# Patient Record
Sex: Female | Born: 1950 | Race: White | Hispanic: No | Marital: Single | Smoking: Never smoker
Health system: Southern US, Community
[De-identification: ages and names within clinical notes are randomized; demographics above are authoritative.]

## PROBLEM LIST (undated history)

## (undated) DIAGNOSIS — F32A Depression, unspecified: Secondary | ICD-10-CM

## (undated) DIAGNOSIS — H269 Unspecified cataract: Secondary | ICD-10-CM

## (undated) DIAGNOSIS — J309 Allergic rhinitis, unspecified: Secondary | ICD-10-CM

## (undated) DIAGNOSIS — G8929 Other chronic pain: Secondary | ICD-10-CM

## (undated) DIAGNOSIS — J45909 Unspecified asthma, uncomplicated: Secondary | ICD-10-CM

## (undated) DIAGNOSIS — R233 Spontaneous ecchymoses: Secondary | ICD-10-CM

## (undated) DIAGNOSIS — H409 Unspecified glaucoma: Secondary | ICD-10-CM

## (undated) DIAGNOSIS — T8859XA Other complications of anesthesia, initial encounter: Secondary | ICD-10-CM

## (undated) DIAGNOSIS — R238 Other skin changes: Secondary | ICD-10-CM

## (undated) DIAGNOSIS — M810 Age-related osteoporosis without current pathological fracture: Secondary | ICD-10-CM

## (undated) DIAGNOSIS — J189 Pneumonia, unspecified organism: Secondary | ICD-10-CM

## (undated) DIAGNOSIS — D649 Anemia, unspecified: Secondary | ICD-10-CM

## (undated) DIAGNOSIS — M2550 Pain in unspecified joint: Secondary | ICD-10-CM

## (undated) DIAGNOSIS — E119 Type 2 diabetes mellitus without complications: Secondary | ICD-10-CM

## (undated) DIAGNOSIS — M1990 Unspecified osteoarthritis, unspecified site: Secondary | ICD-10-CM

## (undated) DIAGNOSIS — F429 Obsessive-compulsive disorder, unspecified: Secondary | ICD-10-CM

## (undated) DIAGNOSIS — K219 Gastro-esophageal reflux disease without esophagitis: Secondary | ICD-10-CM

## (undated) DIAGNOSIS — M549 Dorsalgia, unspecified: Secondary | ICD-10-CM

## (undated) DIAGNOSIS — F329 Major depressive disorder, single episode, unspecified: Secondary | ICD-10-CM

## (undated) DIAGNOSIS — E785 Hyperlipidemia, unspecified: Secondary | ICD-10-CM

## (undated) DIAGNOSIS — I10 Essential (primary) hypertension: Secondary | ICD-10-CM

## (undated) DIAGNOSIS — E559 Vitamin D deficiency, unspecified: Secondary | ICD-10-CM

## (undated) DIAGNOSIS — M62838 Other muscle spasm: Secondary | ICD-10-CM

## (undated) DIAGNOSIS — R7302 Impaired glucose tolerance (oral): Secondary | ICD-10-CM

## (undated) DIAGNOSIS — Z8709 Personal history of other diseases of the respiratory system: Secondary | ICD-10-CM

## (undated) HISTORY — PX: TONGUE BIOPSY: SHX1075

## (undated) HISTORY — DX: Vitamin D deficiency, unspecified: E55.9

## (undated) HISTORY — DX: Allergic rhinitis, unspecified: J30.9

## (undated) HISTORY — DX: Essential (primary) hypertension: I10

## (undated) HISTORY — PX: TONSILLECTOMY AND ADENOIDECTOMY: SUR1326

## (undated) HISTORY — DX: Anemia, unspecified: D64.9

## (undated) HISTORY — DX: Unspecified osteoarthritis, unspecified site: M19.90

## (undated) HISTORY — PX: EYE SURGERY: SHX253

## (undated) HISTORY — PX: ESOPHAGOGASTRODUODENOSCOPY: SHX1529

## (undated) HISTORY — DX: Age-related osteoporosis without current pathological fracture: M81.0

## (undated) HISTORY — PX: WISDOM TOOTH EXTRACTION: SHX21

## (undated) HISTORY — DX: Unspecified asthma, uncomplicated: J45.909

## (undated) HISTORY — DX: Unspecified glaucoma: H40.9

## (undated) HISTORY — DX: Type 2 diabetes mellitus without complications: E11.9

## (undated) HISTORY — DX: Hyperlipidemia, unspecified: E78.5

## (undated) HISTORY — DX: Impaired glucose tolerance (oral): R73.02

## (undated) HISTORY — PX: ELBOW SURGERY: SHX618

---

## 2000-05-26 ENCOUNTER — Emergency Department (HOSPITAL_COMMUNITY): Admission: EM | Admit: 2000-05-26 | Discharge: 2000-05-26 | Payer: Self-pay | Admitting: Emergency Medicine

## 2000-10-06 ENCOUNTER — Other Ambulatory Visit: Admission: RE | Admit: 2000-10-06 | Discharge: 2000-10-06 | Payer: Self-pay | Admitting: Internal Medicine

## 2003-11-12 ENCOUNTER — Emergency Department (HOSPITAL_COMMUNITY): Admission: EM | Admit: 2003-11-12 | Discharge: 2003-11-13 | Payer: Self-pay | Admitting: Emergency Medicine

## 2003-11-18 ENCOUNTER — Ambulatory Visit (HOSPITAL_BASED_OUTPATIENT_CLINIC_OR_DEPARTMENT_OTHER): Admission: RE | Admit: 2003-11-18 | Discharge: 2003-11-18 | Payer: Self-pay | Admitting: Orthopedic Surgery

## 2004-07-05 ENCOUNTER — Ambulatory Visit: Payer: Self-pay | Admitting: Internal Medicine

## 2009-02-11 ENCOUNTER — Other Ambulatory Visit: Admission: RE | Admit: 2009-02-11 | Discharge: 2009-02-11 | Payer: Self-pay | Admitting: Internal Medicine

## 2009-06-08 ENCOUNTER — Encounter: Admission: RE | Admit: 2009-06-08 | Discharge: 2009-06-08 | Payer: Self-pay | Admitting: Internal Medicine

## 2009-06-29 ENCOUNTER — Encounter: Admission: RE | Admit: 2009-06-29 | Discharge: 2009-06-29 | Payer: Self-pay | Admitting: Internal Medicine

## 2009-12-18 ENCOUNTER — Encounter: Admission: RE | Admit: 2009-12-18 | Discharge: 2009-12-18 | Payer: Self-pay | Admitting: Internal Medicine

## 2010-07-30 NOTE — Op Note (Signed)
NAME:  Amanda Odonnell, Amanda Odonnell                        ACCOUNT NO.:  [id number]   MEDICAL RECORD NO.:  [id number]                   PATIENT TYPE:  AMB   LOCATION:  DSC                                  FACILITY:  [facility]   PHYSICIAN:  Katy Fitch. Naaman Plummer., M.D.          DATE OF BIRTH:  1950-11-17   DATE OF PROCEDURE:  11/18/2003  DATE OF DISCHARGE:                                 OPERATIVE REPORT   PREOPERATIVE DIAGNOSIS:  Comminuted Monteggia fracture dislocation of right  elbow with three part displaced radial head fracture and posterior  dislocation of radius with disruption of proximal radial ulnar joint.   POSTOPERATIVE DIAGNOSIS:  Comminuted Monteggia fracture dislocation of right  elbow with three part displaced radial head fracture and posterior  dislocation of radius with disruption of proximal radial ulnar joint.   OPERATION:  1.  Open reduction and internal fixation of right ulna with 90/90 congruent      titanium plates.  2.  Open reduction and internal fixation of right radial head utilizing 5      Leibinger lag screws.  3.  Reconstruction of capsule of radiocapitellar and proximal radial ulnar      joint.   CO-SURGEON:  Katy Fitch Sypher, M.D.  Cindee Salt, M.D.   ANESTHESIA:  General by LMA.   ANESTHESIOLOGIST:  Sheldon Silvan, M.D.  Janetta Hora. Frederick, M.D.   TOURNIQUET TIME:  2 hours at 240 mmHg.   ESTIMATED BLOOD LOSS:  Minimal.   COMPLICATIONS:  None apparent.   INDICATIONS FOR PROCEDURE:  Amanda Odonnell is a 60 year old right hand  dominant employee of [employer].  On November 13, 2003, she fell onto  her outstretched right arm sustaining an extremely complex fracture  dislocation of her right elbow while descending steps at [employer].  She was transported to the [facility] emergency room  where she was evaluated by Dr. Gray Bernhardt.  Dr. Effie Shy noted an intact  neurovascular status and obvious deformity of the elbow region.  Plane  x-  rays demonstrated a severely comminuted fracture dislocation of the elbow.  Dr. Jearld Adjutant was on call for orthopedics at [facility] and saw Ms.  Nee for consultation.  He noted a posterior dislocation of the proximal  radius and after informed consent, performed a closed reduction and  placement in a posterior elbow splint.  He sought an upper extremity trauma  consult.  Ms. Lazaro was stabilized and discharged home with prescriptions  for pain medications.  She was seen on November 14, 2003, [facility] [place] [facility] for evaluation and preoperative counseling.  We reviewed her  x-rays and clinical examination.  She was noted to have intact function of  her median, ulnar, and radial nerves and only 2+ swelling of her fingers.  She did not appear to have any shoulder injuries.  Her past medical history  is reviewed in detail.  We advised  her to proceed with elective open  reduction and internal fixation initially scheduled for November 17, 2003,  at [facility], however, due to a change in her orthopedic trauma  protocol, she was resched[facility] at [facility] time.  Preoperatively, we reviewed the potential benefits of surgery including  anatomic reduction of her radius and ulna as well as reconstruction of her  proximal radial ulnar joint allowing maximum recovery of pronation and  supination with elbow flexion extension as well as stability.  The potential  risks given the nature of this fracture include a neurovascular injury,  infection, nonunion, failure of fixation, and a significant risk of  development of a proximal radial ulnar synostosis due to the severe  comminution of the fractured and marked soft tissue disruption of the  proximal radial ulnar joint.  After a period of invited questions and  answers, she elected to proceed with surgery at this time.   PROCEDURE:  Shalice Woodring is brought to the operating room and placed on  supine  position on the operating table.  Following anesthesia consultation  by Dr. Ivin Booty, general anesthesia by LMA technique was induced.  The right  arm was supported on a hand table and prepped with Betadine solution to the  axilla.  A sterile stockinette and a sterile pneumatic tourniquet was  applied to the proximal brachium followed by impervious arthroscopy drapes.  1 gram Ancef was administered as an IV prophylactic antibiotic.  The right  arm was exsanguinated with an Esmarch bandage and the arterial tourniquet  inflated to 240 mmHg.  The procedure commenced with a 20+ cm longitudinal  incision extending from the triceps insertion on the olecranon along the  posterior subcutaneous border of the ulna over a distance of approximately  15 cm into the forearm.  The subcutaneous tissues were carefully divided  taking care to spread the longitudinal veins and sensory nerves.  The  subcutaneous border of the ulna was located distally and the distal fragment  of the ulna gently exposed.  The proximal fragment of the ulna was,  likewise, exposed with subperiosteal dissection along its ulnar border and a  large triangular free fragment of the ulna as well as several other small  butterfly fragments were identified.  Small free fragments devoid of blood  supply were discarded.  Organized hematoma was removed from the fracture  site followed by clearance of all periosteum from the fracture lines.  Given  the complex radial head injury, we elected to reconstruct the radial head  primarily.  The radial head was easily visualized through the posterior  wound.  With maneuvers of pronation and supination, the radial head  fragments were recovered followed by assembling and holding with hemostats  and clamps.  A total of 5 titanium lag screws were placed using C-arm  fluoroscopic control to monitor screw length.  The radius was reduced and full pronation supination was recovered with apparent stability of  the  proximal radial ulnar joint with the ulna held in near anatomic reduction.  We then applied a congruent posterior olecranon plate spanning the fracture  with at least eight cortices on the proximal and distal sides of the  fracture.  A very challenging butterfly fragment was ultimately controlled  and replaced in anatomic position.  Given the complexity of the butterfly, a  90/90 plate was applied with an additional four hole plate trapping the  butterfly along the ulnar aspect of the proximal ulna.  AP and lateral  C-arm  images confirmed anatomic reconstruction.  The wound was then thoroughly  irrigated with sterile saline followed by repair of the flexor carpi ulnaris  and extensor carpi ulnaris muscles with a running suture of 0 Vicryl  followed by repair of the skin with interrupted suture of 3-0 Vicryl and  intradermal 3-0 Prolene with Steri-Strips.  A voluminous gauze and sterile  Webril dressing was applied followed by application of a radial ulnar elbow  extension limitation splint with Ace wrap.  There were no apparent  complications.  Ms. Ballinger tolerated the surgery and anesthsia well.  She  was awakened from general anesthesia and transferred to the recovery room  with stable vital signs.  She  will be [facility] for observation of her vital  signs with Ancef 1 gram IV q.8h. as prophylactic antibiotic x 3 doses and  analgesics in the form of IV PCA morphine, IV and p.o. Dilaudid and her  routine medications.                                               Katy Fitch Naaman Plummer., M.D.    RVS/MEDQ  D:  11/18/2003  T:  11/18/2003  Job:  [id number]   cc:   Cindee Salt, M.D.  [address]  [place]  [place] [address]  Fax: [phone]   V. Charlesetta Shanks, M.D.  [address]  [place], [place] [address]  Fax: [phone]

## 2011-01-26 ENCOUNTER — Other Ambulatory Visit: Payer: Self-pay | Admitting: Internal Medicine

## 2011-01-26 DIAGNOSIS — N63 Unspecified lump in unspecified breast: Secondary | ICD-10-CM

## 2011-02-09 ENCOUNTER — Ambulatory Visit
Admission: RE | Admit: 2011-02-09 | Discharge: 2011-02-09 | Disposition: A | Discharge status: Home / Self Care | Payer: 59 | Source: Ambulatory Visit | Attending: Internal Medicine | Admitting: Internal Medicine

## 2011-02-09 DIAGNOSIS — N63 Unspecified lump in unspecified breast: Secondary | ICD-10-CM

## 2012-07-03 ENCOUNTER — Other Ambulatory Visit: Payer: Self-pay

## 2012-07-03 DIAGNOSIS — Z1231 Encounter for screening mammogram for malignant neoplasm of breast: Secondary | ICD-10-CM

## 2012-07-10 ENCOUNTER — Encounter: Payer: Self-pay | Admitting: Gastroenterology

## 2012-07-12 ENCOUNTER — Other Ambulatory Visit: Payer: Self-pay | Admitting: Internal Medicine

## 2012-07-12 DIAGNOSIS — R634 Abnormal weight loss: Secondary | ICD-10-CM

## 2012-07-18 ENCOUNTER — Ambulatory Visit
Admission: RE | Admit: 2012-07-18 | Discharge: 2012-07-18 | Disposition: A | Discharge status: Home / Self Care | Payer: 59 | Source: Ambulatory Visit | Attending: Internal Medicine | Admitting: Internal Medicine

## 2012-07-18 DIAGNOSIS — R634 Abnormal weight loss: Secondary | ICD-10-CM

## 2012-07-25 ENCOUNTER — Ambulatory Visit
Admission: RE | Admit: 2012-07-25 | Discharge: 2012-07-25 | Disposition: A | Discharge status: Home / Self Care | Payer: 59 | Source: Ambulatory Visit

## 2012-07-25 DIAGNOSIS — Z1231 Encounter for screening mammogram for malignant neoplasm of breast: Secondary | ICD-10-CM

## 2012-08-01 ENCOUNTER — Encounter: Payer: Self-pay | Admitting: Gastroenterology

## 2012-08-01 ENCOUNTER — Ambulatory Visit (INDEPENDENT_AMBULATORY_CARE_PROVIDER_SITE_OTHER): Payer: 59 | Admitting: Gastroenterology

## 2012-08-01 VITALS — BP 124/70 | HR 96 | Ht 60.0 in | Wt 154.4 lb

## 2012-08-01 DIAGNOSIS — D509 Iron deficiency anemia, unspecified: Secondary | ICD-10-CM

## 2012-08-01 MED ORDER — PEG-KCL-NACL-NASULF-NA ASC-C 100 G PO SOLR: 1.0000 | Freq: Once | ORAL | Status: DC

## 2012-08-01 NOTE — Patient Instructions (Addendum)
You have been scheduled for an endoscopy and colonoscopy with propofol. Please follow the written instructions given to you at your visit today. Please pick up your prep at the pharmacy within the next 1-3 days. If you use inhalers (even only as needed), please bring them with you on the day of your procedure. Your physician has requested that you go to www.startemmi.com and enter the access code given to you at your visit today. This web site gives a general overview about your procedure. However, you should still follow specific instructions given to you by our office regarding your preparation for the procedure.  Thank you for choosing me and [facility].  Venita Lick. Pleas Koch., MD., Clementeen Graham  cc: Pearson Grippe, MD

## 2012-08-01 NOTE — Progress Notes (Signed)
History of Present Illness: This is a 62 year old female was recently found to have an iron deficiency anemia, hemoglobin = 10.6,  ferritin = 10. She has no gastrointestinal complaints. She is not previously at colonoscopy or endoscopy. Denies weight loss, abdominal pain, constipation, diarrhea, change in stool caliber, melena, hematochezia, nausea, vomiting, dysphagia, reflux symptoms, chest pain.  Review of Systems: Pertinent positive and negative review of systems were noted in the above HPI section. All other review of systems were otherwise negative.  Current Medications, Allergies, Past Medical History, Past Surgical History, Family History and Social History were reviewed in [employer] record.  Physical Exam: General: Well developed , well nourished, no acute distress Head: Normocephalic and atraumatic Eyes:  sclerae anicteric, EOMI Ears: Normal auditory acuity Mouth: No deformity or lesions Neck: Supple, no masses or thyromegaly Lungs: Clear throughout to auscultation Heart: Regular rate and rhythm; no murmurs, rubs or bruits Abdomen: Soft, non tender and non distended. No masses, hepatosplenomegaly or hernias noted. Normal Bowel sounds Rectal: Deferred to colonoscopy Musculoskeletal: Symmetrical with no gross deformities  Skin: No lesions on visible extremities Pulses:  Normal pulses noted Extremities: No clubbing, cyanosis, edema or deformities noted Neurological: Alert oriented x 4, grossly nonfocal Cervical Nodes:  No significant cervical adenopathy Inguinal Nodes: No significant inguinal adenopathy Psychological:  Alert and cooperative. Normal mood and affect  Assessment and Recommendations:  1. Unexplained iron deficiency anemia. Rule out occult gastrointestinal losses or poor iron absorption. She was somewhat reluctant to schedule colonoscopy and upper endoscopy but ultimately agreed to do so. The risks, benefits, and alternatives to colonoscopy with  possible biopsy and possible polypectomy were discussed with the patient and they consent to proceed. The risks, benefits, and alternatives to endoscopy with possible biopsy and possible dilation were discussed with the patient and they consent to proceed.

## 2012-08-08 ENCOUNTER — Observation Stay (HOSPITAL_COMMUNITY): Payer: 59

## 2012-08-08 ENCOUNTER — Observation Stay (HOSPITAL_COMMUNITY)
Admission: AD | Admit: 2012-08-08 | Discharge: 2012-08-09 | Disposition: A | Discharge status: Home / Self Care | Payer: 59 | Source: Ambulatory Visit | Attending: Gastroenterology | Admitting: Gastroenterology

## 2012-08-08 ENCOUNTER — Ambulatory Visit (AMBULATORY_SURGERY_CENTER): Payer: 59 | Admitting: Gastroenterology

## 2012-08-08 ENCOUNTER — Encounter: Payer: Self-pay | Admitting: Gastroenterology

## 2012-08-08 ENCOUNTER — Encounter (HOSPITAL_COMMUNITY): Payer: Self-pay | Admitting: *Deleted

## 2012-08-08 VITALS — BP 136/64 | HR 80 | Temp 98.2°F | Resp 23 | Ht 60.0 in | Wt 154.0 lb

## 2012-08-08 DIAGNOSIS — J449 Chronic obstructive pulmonary disease, unspecified: Secondary | ICD-10-CM | POA: Insufficient documentation

## 2012-08-08 DIAGNOSIS — R0902 Hypoxemia: Secondary | ICD-10-CM

## 2012-08-08 DIAGNOSIS — I10 Essential (primary) hypertension: Secondary | ICD-10-CM | POA: Insufficient documentation

## 2012-08-08 DIAGNOSIS — J954 Chemical pneumonitis due to anesthesia: Principal | ICD-10-CM

## 2012-08-08 DIAGNOSIS — E119 Type 2 diabetes mellitus without complications: Secondary | ICD-10-CM | POA: Insufficient documentation

## 2012-08-08 DIAGNOSIS — D509 Iron deficiency anemia, unspecified: Secondary | ICD-10-CM

## 2012-08-08 DIAGNOSIS — J690 Pneumonitis due to inhalation of food and vomit: Secondary | ICD-10-CM

## 2012-08-08 DIAGNOSIS — Y849 Medical procedure, unspecified as the cause of abnormal reaction of the patient, or of later complication, without mention of misadventure at the time of the procedure: Secondary | ICD-10-CM | POA: Insufficient documentation

## 2012-08-08 DIAGNOSIS — E7800 Pure hypercholesterolemia, unspecified: Secondary | ICD-10-CM | POA: Insufficient documentation

## 2012-08-08 DIAGNOSIS — J4489 Other specified chronic obstructive pulmonary disease: Secondary | ICD-10-CM | POA: Insufficient documentation

## 2012-08-08 HISTORY — PX: COLONOSCOPY: SHX174

## 2012-08-08 LAB — CREATININE, SERUM
Creatinine, Ser: 0.69 mg/dL (ref 0.50–1.10)
GFR calc non Af Amer: >90 mL/min (ref >90)

## 2012-08-08 LAB — GLUCOSE, CAPILLARY: Glucose-Capillary: 119 mg/dL — ABNORMAL HIGH (ref 70–99)

## 2012-08-08 LAB — CBC
Hemoglobin: 10.3 g/dL — ABNORMAL LOW (ref 12.0–15.0)
MCH: 23.6 pg — ABNORMAL LOW (ref 26.0–34.0)
Platelets: 334 10*3/uL (ref 150–400)
RBC: 4.37 MIL/uL (ref 3.87–5.11)
WBC: 13.2 10*3/uL — ABNORMAL HIGH (ref 4.0–10.5)

## 2012-08-08 MED ORDER — ONDANSETRON HCL 4 MG/2ML IJ SOLN: 4.0000 mg | Freq: Four times a day (QID) | INTRAMUSCULAR | Status: DC | PRN

## 2012-08-08 MED ORDER — MOMETASONE FURO-FORMOTEROL FUM 100-5 MCG/ACT IN AERO: 2.0000 | INHALATION_SPRAY | Freq: Two times a day (BID) | RESPIRATORY_TRACT | Status: DC

## 2012-08-08 MED ORDER — INDAPAMIDE 2.5 MG PO TABS
2.5000 mg | ORAL_TABLET | Freq: Every day | ORAL | Status: DC
  Administered 2012-08-09: 2.5 mg via ORAL
  Filled 2012-08-08: qty 1

## 2012-08-08 MED ORDER — ONDANSETRON HCL 4 MG PO TABS: 4.0000 mg | ORAL_TABLET | Freq: Four times a day (QID) | ORAL | Status: DC | PRN

## 2012-08-08 MED ORDER — ENOXAPARIN SODIUM 40 MG/0.4ML ~~LOC~~ SOLN
40.0000 mg | SUBCUTANEOUS | Status: DC
  Administered 2012-08-08: 40 mg via SUBCUTANEOUS
  Filled 2012-08-08 (×2): qty 0.4

## 2012-08-08 MED ORDER — TRAVOPROST (BAK FREE) 0.004 % OP SOLN: 1.0000 [drp] | Freq: Every day | OPHTHALMIC | Status: DC

## 2012-08-08 MED ORDER — ASPIRIN 81 MG PO CHEW
81.0000 mg | CHEWABLE_TABLET | Freq: Every day | ORAL | Status: DC
  Administered 2012-08-09: 81 mg via ORAL
  Filled 2012-08-08 (×2): qty 1

## 2012-08-08 MED ORDER — ACETAMINOPHEN 650 MG RE SUPP: 650.0000 mg | Freq: Four times a day (QID) | RECTAL | Status: DC | PRN

## 2012-08-08 MED ORDER — VERAPAMIL HCL ER 360 MG PO CP24: 360.0000 mg | ORAL_CAPSULE | Freq: Every day | ORAL | Status: DC

## 2012-08-08 MED ORDER — BRINZOLAMIDE-BRIMONIDINE 1-0.2 % OP SUSP: 1.0000 [drp] | Freq: Two times a day (BID) | OPHTHALMIC | Status: DC

## 2012-08-08 MED ORDER — SODIUM CHLORIDE 0.9 % IV SOLN: 500.0000 mL | INTRAVENOUS | Status: DC

## 2012-08-08 MED ORDER — SERTRALINE HCL 100 MG PO TABS
100.0000 mg | ORAL_TABLET | Freq: Every day | ORAL | Status: DC
  Administered 2012-08-08: 100 mg via ORAL
  Filled 2012-08-08 (×2): qty 1

## 2012-08-08 MED ORDER — ASPIRIN 81 MG PO TABS: 81.0000 mg | ORAL_TABLET | Freq: Every day | ORAL | Status: DC

## 2012-08-08 MED ORDER — MAGNESIUM 300 MG PO CAPS: 1.0000 | ORAL_CAPSULE | Freq: Every day | ORAL | Status: DC

## 2012-08-08 MED ORDER — SODIUM CHLORIDE 0.45 % IV SOLN: INTRAVENOUS | Status: DC

## 2012-08-08 MED ORDER — SODIUM CHLORIDE 0.9 % IV SOLN: 4.0000 mg | Freq: Four times a day (QID) | INTRAVENOUS | Status: DC | PRN

## 2012-08-08 MED ORDER — VERAPAMIL HCL ER 120 MG PO TBCR: 360.0000 mg | EXTENDED_RELEASE_TABLET | Freq: Every day | ORAL | Status: DC

## 2012-08-08 MED ORDER — PIPERACILLIN SOD-TAZOBACTAM SO 2.25 (2-0.25) G IV SOLR: 3.3750 g | Freq: Four times a day (QID) | INTRAVENOUS | Status: DC

## 2012-08-08 MED ORDER — DIPHENHYDRAMINE HCL 25 MG PO CAPS: 25.0000 mg | ORAL_CAPSULE | Freq: Four times a day (QID) | ORAL | Status: DC | PRN

## 2012-08-08 MED ORDER — MONTELUKAST SODIUM 10 MG PO TABS: 10.0000 mg | ORAL_TABLET | Freq: Every day | ORAL | Status: DC

## 2012-08-08 MED ORDER — VERAPAMIL HCL ER 180 MG PO TBCR
360.0000 mg | EXTENDED_RELEASE_TABLET | Freq: Every day | ORAL | Status: DC
  Administered 2012-08-08: 360 mg via ORAL
  Filled 2012-08-08 (×2): qty 2

## 2012-08-08 MED ORDER — MOMETASONE FURO-FORMOTEROL FUM 100-5 MCG/ACT IN AERO
2.0000 | INHALATION_SPRAY | Freq: Two times a day (BID) | RESPIRATORY_TRACT | Status: DC
  Administered 2012-08-08 – 2012-08-09 (×2): 2 via RESPIRATORY_TRACT
  Filled 2012-08-08: qty 8.8

## 2012-08-08 MED ORDER — BRINZOLAMIDE 1 % OP SUSP
1.0000 [drp] | Freq: Two times a day (BID) | OPHTHALMIC | Status: DC
  Administered 2012-08-08 – 2012-08-09 (×2): 1 [drp] via OPHTHALMIC
  Filled 2012-08-08: qty 10

## 2012-08-08 MED ORDER — ACETAMINOPHEN 325 MG PO TABS
650.0000 mg | ORAL_TABLET | Freq: Four times a day (QID) | ORAL | Status: DC | PRN
  Administered 2012-08-09: 650 mg via ORAL
  Filled 2012-08-08: qty 2

## 2012-08-08 MED ORDER — FAMOTIDINE 20 MG PO TABS
20.0000 mg | ORAL_TABLET | Freq: Two times a day (BID) | ORAL | Status: DC
  Administered 2012-08-08 – 2012-08-09 (×2): 20 mg via ORAL
  Filled 2012-08-08 (×3): qty 1

## 2012-08-08 MED ORDER — SERTRALINE HCL 25 MG PO TABS: 100.0000 mg | ORAL_TABLET | Freq: Every day | ORAL | Status: DC

## 2012-08-08 MED ORDER — ALBUTEROL SULFATE HFA 108 (90 BASE) MCG/ACT IN AERS
2.0000 | INHALATION_SPRAY | Freq: Every day | RESPIRATORY_TRACT | Status: DC
  Administered 2012-08-09: 2 via RESPIRATORY_TRACT
  Filled 2012-08-08: qty 6.7

## 2012-08-08 MED ORDER — MONTELUKAST SODIUM 10 MG PO TABS
10.0000 mg | ORAL_TABLET | Freq: Every day | ORAL | Status: DC
  Administered 2012-08-08: 10 mg via ORAL
  Filled 2012-08-08 (×2): qty 1

## 2012-08-08 MED ORDER — ALBUTEROL SULFATE (5 MG/ML) 0.5% IN NEBU: 2.5000 mg | INHALATION_SOLUTION | Freq: Four times a day (QID) | RESPIRATORY_TRACT | Status: DC

## 2012-08-08 MED ORDER — METHOCARBAMOL 500 MG PO TABS
500.0000 mg | ORAL_TABLET | Freq: Four times a day (QID) | ORAL | Status: DC
  Administered 2012-08-08 – 2012-08-09 (×3): 500 mg via ORAL
  Filled 2012-08-08 (×5): qty 1

## 2012-08-08 MED ORDER — ACETAMINOPHEN 325 MG PO TABS: 650.0000 mg | ORAL_TABLET | Freq: Four times a day (QID) | ORAL | Status: DC | PRN

## 2012-08-08 MED ORDER — BRIMONIDINE TARTRATE 0.2 % OP SOLN
1.0000 [drp] | Freq: Two times a day (BID) | OPHTHALMIC | Status: DC
  Administered 2012-08-08 – 2012-08-09 (×2): 1 [drp] via OPHTHALMIC
  Filled 2012-08-08: qty 5

## 2012-08-08 MED ORDER — TRAVOPROST (BAK FREE) 0.004 % OP SOLN
1.0000 [drp] | Freq: Every day | OPHTHALMIC | Status: DC
  Administered 2012-08-08: 1 [drp] via OPHTHALMIC
  Filled 2012-08-08: qty 2.5

## 2012-08-08 MED ORDER — PANTOPRAZOLE SODIUM 20 MG PO TBEC: 40.0000 mg | DELAYED_RELEASE_TABLET | Freq: Two times a day (BID) | ORAL | Status: DC

## 2012-08-08 MED ORDER — TRAVOPROST 0.004 % OP SOLN: 1.0000 [drp] | Freq: Every day | OPHTHALMIC | Status: DC

## 2012-08-08 NOTE — Progress Notes (Signed)
DECISION TO TRANSFER TO [facility] FOR ADMISSION. DR Russella Dar CONTACTING PA , [name] WHO CONTACTED BED CONTROL. ROOM ASSIGNMENT OBTAINED AT 1650. DECISION  FOR AMBULANCE TRANSPORT WITH 02 AND RUNNING IV NS. 911 CALLED FOR TRANSPORT. PATIENT VOIDED 120 ML YELLOW URINE AT [address]. PATIENT TRANSFERRED VIA [facility] AT 1512. 02 AND RUNNING NS. PATIENT STATING SHE IS NOT FEELING WELL, COLOR PALE, NO DYSPNEA NOTED. NO NAUSEA OR VOMITING. VITAL SIGNS STABLE, TEMP 98.2

## 2012-08-08 NOTE — Progress Notes (Signed)
Patient did not have preoperative order for IV antibiotic SSI prophylaxis. (G8918)Patient experienced a hospital transfer or hospital admission upon discharge from [facility]. [phone])

## 2012-08-08 NOTE — Progress Notes (Addendum)
Lidocaine-40mg  IV prior to Propofol InductionPropofol given over incremental dosages. Only colonoscopy done due to vomiting episode. Sat dropped to 80-90. Dr Russella Dar aware and possible post op CXR will be ordered.CAB-BBS prior to PACU admit. Awake and able to cough. Chest clear.

## 2012-08-08 NOTE — H&P (Signed)
Primary Care Physician:  Pearson Grippe, MD Primary Gastroenterologist:   Claudette Head, MD  CHIEF COMPLAINT:  Hypoxemia, suspected aspiration  HPI: Amanda Odonnell is a 62 y.o. female who underwent colonoscopy for evaluation of fe deficiency anemia. The colonoscopy was normal and the EGD was cancelled due to agitation, pt movement, nausea, vomiting and then hypoxemia with suspected aspiration event. Her O2 sat preprocedure was 98% on RA and afterward was 88% on RA. Increased to 91-93% on 2 liters O2 by . She has underlying COPD and asthma. She took her inhaler earlier today and post procedure. She notes a sore throat and mid cough. No other complaints. She had a root canal yesterday but has not started Amoxicillin as prescribed.   Past Medical History  Diagnosis Date  . Hypertension   . Hyperlipidemia   . Glucose intolerance (impaired glucose tolerance)   . Asthma   . Allergic rhinitis   . Vitamin D deficiency   . Osteoporosis   . Osteoarthritis   . Stress fracture of hip     femeur of right hip  . Anemia   . COPD (chronic obstructive pulmonary disease)   . DM (diabetes mellitus)   . Anxiety   . Glaucoma     Past Surgical History  Procedure Laterality Date  . Tonsillectomy and adenoidectomy    . Tongue biopsy    . Wisdom tooth extraction    . Elbow surgery Right     plates and pins    Prior to Admission medications   Medication Sig Start Date End Date Taking? Authorizing Provider  aspirin 81 MG tablet Take 81 mg by mouth daily.   Yes Historical Provider, MD  beta carotene 57846 UNIT capsule Take 25,000 Units by mouth daily.   Yes Historical Provider, MD  Brinzolamide-Brimonidine [facility]) 1-0.2 % SUSP Place 1 drop into both eyes 2 (two) times daily.   Yes Historical Provider, MD  Calcium-Magnesium-Vitamin D (CALCIUM 500 PO) Take 1 tablet by mouth 2 (two) times daily.   Yes Historical Provider, MD  cholecalciferol (VITAMIN D) 1000 UNITS tablet Take 1,000 Units by mouth  daily.   Yes Historical Provider, MD  Cinnamon 500 MG capsule Take 500 mg by mouth daily.   Yes Historical Provider, MD  Coenzyme Q10 (CO Q 10) 100 MG CAPS Take 1 capsule by mouth daily.   Yes Historical Provider, MD  diphenhydrAMINE (BENADRYL ALLERGY) 25 mg capsule Take 25 mg by mouth every 6 (six) hours as needed for itching.   Yes Historical Provider, MD  Fluticasone-Salmeterol (ADVAIR) 100-50 MCG/DOSE AEPB Inhale 1 puff into the lungs 2 (two) times daily.   Yes Historical Provider, MD  ibuprofen (ADVIL,MOTRIN) 200 MG tablet Take 200 mg by mouth every 6 (six) hours as needed for pain.   Yes Historical Provider, MD  indapamide (LOZOL) 2.5 MG tablet Take 2.5 mg by mouth daily.   Yes Historical Provider, MD  Magnesium 300 MG CAPS Take 1 capsule by mouth daily.   Yes Historical Provider, MD  methocarbamol (ROBAXIN) 500 MG tablet Take 500 mg by mouth 4 (four) times daily.   Yes Historical Provider, MD  Misc Natural Products (TART CHERRY ADVANCED PO) Take by mouth as directed.   Yes Historical Provider, MD  montelukast (SINGULAIR) 10 MG tablet Take 10 mg by mouth at bedtime.   Yes Historical Provider, MD  naproxen sodium (ANAPROX) 220 MG tablet Take 220 mg by mouth as needed.   Yes Historical Provider, MD  ranitidine (ZANTAC) 150 MG  capsule Take 150 mg by mouth 2 (two) times daily.   Yes Historical Provider, MD  rosuvastatin (CRESTOR) 10 MG tablet Take 10 mg by mouth daily.   Yes Historical Provider, MD  sertraline (ZOLOFT) 100 MG tablet Take 100 mg by mouth daily.   Yes Historical Provider, MD  travoprost, benzalkonium, (TRAVATAN) 0.004 % ophthalmic solution 1 drop at bedtime.   Yes Historical Provider, MD  verapamil (VERELAN PM) 360 MG 24 hr capsule Take 360 mg by mouth at bedtime.   Yes Historical Provider, MD  albuterol (PROVENTIL HFA;VENTOLIN HFA) 108 (90 BASE) MCG/ACT inhaler Inhale 2 puffs into the lungs daily.    Historical Provider, MD  Omega-3 Fatty Acids (FISH OIL) 500 MG CAPS Take 1  capsule by mouth 2 (two) times daily.    Historical Provider, MD    Current Outpatient Prescriptions  Medication Sig Dispense Refill  . aspirin 81 MG tablet Take 81 mg by mouth daily.      . beta carotene 16109 UNIT capsule Take 25,000 Units by mouth daily.      . Brinzolamide-Brimonidine (SIMBRINZA) 1-0.2 % SUSP Place 1 drop into both eyes 2 (two) times daily.      . Calcium-Magnesium-Vitamin D (CALCIUM 500 PO) Take 1 tablet by mouth 2 (two) times daily.      . cholecalciferol (VITAMIN D) 1000 UNITS tablet Take 1,000 Units by mouth daily.      . Cinnamon 500 MG capsule Take 500 mg by mouth daily.      . Coenzyme Q10 (CO Q 10) 100 MG CAPS Take 1 capsule by mouth daily.      . diphenhydrAMINE (BENADRYL ALLERGY) 25 mg capsule Take 25 mg by mouth every 6 (six) hours as needed for itching.      . Fluticasone-Salmeterol (ADVAIR) 100-50 MCG/DOSE AEPB Inhale 1 puff into the lungs 2 (two) times daily.      Marland Kitchen ibuprofen (ADVIL,MOTRIN) 200 MG tablet Take 200 mg by mouth every 6 (six) hours as needed for pain.      . indapamide (LOZOL) 2.5 MG tablet Take 2.5 mg by mouth daily.      . Magnesium 300 MG CAPS Take 1 capsule by mouth daily.      . methocarbamol (ROBAXIN) 500 MG tablet Take 500 mg by mouth 4 (four) times daily.      . Misc Natural Products (TART CHERRY ADVANCED PO) Take by mouth as directed.      . montelukast (SINGULAIR) 10 MG tablet Take 10 mg by mouth at bedtime.      . naproxen sodium (ANAPROX) 220 MG tablet Take 220 mg by mouth as needed.      . ranitidine (ZANTAC) 150 MG capsule Take 150 mg by mouth 2 (two) times daily.      . rosuvastatin (CRESTOR) 10 MG tablet Take 10 mg by mouth daily.      . sertraline (ZOLOFT) 100 MG tablet Take 100 mg by mouth daily.      . travoprost, benzalkonium, (TRAVATAN) 0.004 % ophthalmic solution 1 drop at bedtime.      . verapamil (VERELAN PM) 360 MG 24 hr capsule Take 360 mg by mouth at bedtime.      Marland Kitchen albuterol (PROVENTIL HFA;VENTOLIN HFA) 108 (90  BASE) MCG/ACT inhaler Inhale 2 puffs into the lungs daily.      . Omega-3 Fatty Acids (FISH OIL) 500 MG CAPS Take 1 capsule by mouth 2 (two) times daily.       Current Facility-Administered Medications  Medication Dose Route Frequency Provider Last Rate Last Dose  . 0.9 %  sodium chloride infusion  500 mL Intravenous Continuous Meryl Dare, MD        Allergies as of 08/08/2012 - Review Complete 08/08/2012  Allergen Reaction Noted  . Sulfa antibiotics Hives 08/01/2012    Family History  Problem Relation Age of Onset  . Heart attack Father   . CVA Father   . Diabetes type II Father   . Ulcerative colitis Father   . Heart disease Paternal Grandmother   . Stroke Maternal Grandmother   . AAA (abdominal aortic aneurysm) Maternal Grandmother   . Breast cancer Maternal Aunt     and great aunt  . Melanoma Mother   . Hiatal hernia Mother     with part of stomach removed  . Clotting disorder Mother   . Anemia Mother   . Hemophilia Maternal Uncle   . Crohn's disease Maternal Aunt     History   Social History  . Marital Status: Single    Spouse Name: N/A    Number of Children: 0  . Years of Education: N/A   Occupational History  . retired [employer]   Social History Main Topics  . Smoking status: Never Smoker   . Smokeless tobacco: Never Used  . Alcohol Use: No  . Drug Use: No  . Sexually Active: Not on file   Other Topics Concern  . Not on file   Social History Narrative  . No narrative on file    Review of Systems:  All systems reviewed an negative except where noted in HPI.  Gen: Denies any fever, chills, sweats, anorexia, fatigue, weakness, malaise, weight loss, and sleep disorder CV: Denies chest pain, angina, palpitations, syncope, orthopnea, PND, peripheral edema, and claudication. Resp: Denies dyspnea at rest, dyspnea with exercise,  sputum, wheezing, coughing up blood, and pleurisy. GI: Denies vomiting blood, jaundice, and fecal incontinence.    Denies dysphagia or odynophagia. GU : Denies urinary burning, blood in urine, urinary frequency, urinary hesitancy, nocturnal urination, and urinary incontinence. MS: Denies joint pain, limitation of movement, and swelling, stiffness, low back pain, extremity pain. Denies muscle weakness, cramps, atrophy.  Derm: Denies rash, itching, dry skin, hives, moles, warts, or unhealing ulcers.  Psych: Denies depression, anxiety, memory loss, suicidal ideation, hallucinations, paranoia, and confusion. Heme: Denies bruising, bleeding, and enlarged lymph nodes. Neuro:  Denies any headaches, dizziness, paresthesias. Endo:  Denies any problems with DM, thyroid, adrenal function.  Physical Exam: Vital signs in last 24 hours:   General:  Pleasant, well-developed, white female in NAD Head:  Normocephalic and atraumatic. Eyes:  Sclera clear, no icterus.   Conjunctiva pink. Ears:  Normal auditory acuity. Mouth:  No deformity or lesions.  Neck:  Supple; no masses . Lungs:  Clear throughout to auscultation.   No wheezes, crackles, or rhonchi. No acute distress. Heart:  Regular rate and rhythm; no murmurs. Abdomen:  Soft, nondistended, nontender. No masses, hepatomegaly. No obvious masses.  Normal bowel .    Rectal:  normal  Msk:  Symmetrical without gross deformities.. Pulses:  Normal pulses noted. Extremities:  Without edema. Neurologic:  Alert and  oriented x4;  grossly normal neurologically. Skin:  Intact without significant lesions or rashes. Cervical Nodes:  No significant cervical adenopathy. Psych:  Alert and cooperative. Normal mood and affect.  Impression / Plan:   1. Mild hypoxemia with suspected aspiration event during colonoscopy secondary to repeated episodes of N/V during sedation. Underlying COPD  and asthma. Supplemental O2, IV antibiotics, chest Xray, blood work today. If symptoms do not improve rapidly will consult Pulmonary.  2. Fe def anemia. No clear source. Consider EGD if fe  replacement is not effective or fe deficiency recurs.  3. Root canal on Tuesday. Needs antibiotics upon discharge.  Venita Lick. Russella Dar MD [facility] 08/08/2012, 4:22 PM

## 2012-08-08 NOTE — Progress Notes (Signed)
1543 PATIENT ALERT AND ORIENTED.PATIENT WITHOUT DYSPNEA, ONLY COMPLAINS OF SORE THROAT. ALBUTEROL INHALER USED, 2 PUFFS, AS ORDERED BY MARK SMITH CRNA. 02 DECREASED TO 2 L/MIN. PATIENT STATING SHE DID HAVE A ROOT CANAL YESTERDAY AND WILL START AN ANTIBIOTIC, AS DIRECTED BY ENDODONTIST, ONCE HOME AND HAS HAD A MEAL. PATIENT STATING THE ANTIBIOTIC IS AMOXICILLIN AND HAS BEEN PRESCRIBED FOR ONE WEEK.

## 2012-08-08 NOTE — Patient Instructions (Addendum)
YOU HAD AN ENDOSCOPIC PROCEDURE TODAY AT THE [facility]: Refer to the procedure report that was given to you for any specific questions about what was found during the examination.  If the procedure report does not answer your questions, please call your gastroenterologist to clarify.  If you requested that your care partner not be given the details of your procedure findings, then the procedure report has been included in a sealed envelope for you to review at your convenience later.  YOU SHOULD EXPECT: Some feelings of bloating in the abdomen. Passage of more gas than usual.  Walking can help get rid of the air that was put into your GI tract during the procedure and reduce the bloating. If you had a lower endoscopy (such as a colonoscopy or flexible sigmoidoscopy) you may notice spotting of blood in your stool or on the toilet paper. If you underwent a bowel prep for your procedure, then you may not have a normal bowel movement for a few days.  DIET: Your first meal following the procedure should be a light meal and then it is ok to progress to your normal diet.  A half-sandwich or bowl of soup is an example of a good first meal.  Heavy or fried foods are harder to digest and may make you feel nauseous or bloated.  Likewise meals heavy in dairy and vegetables can cause extra gas to form and this can also increase the bloating.  Drink plenty of fluids but you should avoid alcoholic beverages for 24 hours.  ACTIVITY: Your care partner should take you home directly after the procedure.  You should plan to take it easy, moving slowly for the rest of the day.  You can resume normal activity the day after the procedure however you should NOT DRIVE or use heavy machinery for 24 hours (because of the sedation medicines used during the test).    SYMPTOMS TO REPORT IMMEDIATELY: A gastroenterologist can be reached at any hour.  During normal business hours, 8:30 AM to 5:00 PM Monday through Friday,  call [phone].  After hours and on weekends, please call the GI answering service at [phone] who will take a message and have the physician on call contact you.   Following lower endoscopy (colonoscopy or flexible sigmoidoscopy):  Excessive amounts of blood in the stool  Significant tenderness or worsening of abdominal pains  Swelling of the abdomen that is new, acute  Fever of 100F or higher    FOLLOW UP: If any biopsies were taken you will be contacted by phone or by letter within the next 1-3 weeks.  Call your gastroenterologist if you have not heard about the biopsies in 3 weeks.  Our staff will call the home number listed on your records the next business day following your procedure to check on you and address any questions or concerns that you may have at that time regarding the information given to you following your procedure. This is a courtesy call and so if there is no answer at the home number and we have not heard from you through the emergency physician on call, we will assume that you have returned to your regular daily activities without incident.  SIGNATURES/CONFIDENTIALITY: You and/or your care partner have signed paperwork which will be entered into your electronic medical record.  These signatures attest to the fact that that the information above on your After Visit Summary has been reviewed and is understood.  Full responsibility of the confidentiality   of this discharge information lies with you and/or your care-partner.     

## 2012-08-08 NOTE — Op Note (Signed)
[facility] [address] [place], [address]   COLONOSCOPY PROCEDURE REPORT  PATIENT: Amanda Odonnell, Amanda Odonnell  MR#: [id number] BIRTHDATE: 1950-10-13 , 62  yrs. old GENDER: Female ENDOSCOPIST: Meryl Dare, MD, [facility] REFERRED JY:NWGNF Kim, M.D. PROCEDURE DATE:  08/08/2012 PROCEDURE:   Colonoscopy, diagnostic ASA CLASS:   Class III INDICATIONS:Iron Deficiency Anemia. MEDICATIONS: MAC sedation, administered by CRNA and propofol (Diprivan) 400mg  IV DESCRIPTION OF PROCEDURE:   After the risks benefits and alternatives of the procedure were thoroughly explained, informed consent was obtained.  A digital rectal exam revealed no abnormalities of the rectum.   The LB [id number]  endoscope was introduced through the anus and advanced to the cecum, which was identified by both the appendix and ileocecal valve. No adverse events experienced with a tortuous colon, patient agitation - frequently moving.   The quality of the prep was excellent, using MoviPrep  The instrument was then slowly withdrawn as the colon was fully examined.  COLON FINDINGS: A normal appearing cecum, ileocecal valve, and appendiceal orifice were identified.  The ascending, hepatic flexure, transverse, splenic flexure, descending, sigmoid colon and rectum appeared unremarkable.  No polyps or cancers were seen. Retroflexed views revealed no abnormalities. The time to cecum=6 minutes 47 seconds.  Withdrawal time=10 minutes 11 seconds.  The scope was withdrawn and the procedure completed.  COMPLICATIONS: There were no complications.  ENDOSCOPIC IMPRESSION: 1.  Normal colon  RECOMMENDATIONS: 1.  You should continue to follow colorectal cancer screening guidelines for "routine risk" patients with a repeat colonoscopy in 10 years.  There is no need for screending FOBT (stool) testing for at least 5 years. 2.  EGD cancelled due to nausea, vomiting, pt movement and decreased O2 saturation 3.  If Fe  deficiency cannot be corrected or recurs consider EGD or UGI series   eSigned:  Meryl Dare, MD, [facility] 08/08/2012 3:25 PM

## 2012-08-08 NOTE — Progress Notes (Signed)
CALLED [facility] AND VERIFIED THAT PRESCRIPTION WAS ORDERED AND PICKED UP, LEANN STATING AMOXICILLIN 500 MG GIVEN BY DR. STAN WILLIAMS, # 25 EVERY 6 HOURS UNTIL COMPLETED. INFORMED DR. [facility] AND MARK SMITH CRNA. 02 DISCONTINUED AT 1603. [id number] 88%, MARK SMITH CRNA AND DR. STARK INFORMED.

## 2012-08-09 ENCOUNTER — Telehealth: Payer: Self-pay | Admitting: *Deleted

## 2012-08-09 ENCOUNTER — Encounter (HOSPITAL_COMMUNITY): Payer: Self-pay | Admitting: Pulmonary Disease

## 2012-08-09 DIAGNOSIS — J690 Pneumonitis due to inhalation of food and vomit: Secondary | ICD-10-CM

## 2012-08-09 DIAGNOSIS — J954 Chemical pneumonitis due to anesthesia: Secondary | ICD-10-CM

## 2012-08-09 LAB — CBC
Hemoglobin: 9 g/dL — ABNORMAL LOW (ref 12.0–15.0)
MCH: 23.2 pg — ABNORMAL LOW (ref 26.0–34.0)
MCHC: 30.7 g/dL (ref 30.0–36.0)
MCV: 75.5 fL — ABNORMAL LOW (ref 78.0–100.0)
Platelets: 312 10*3/uL (ref 150–400)
RBC: 3.88 MIL/uL (ref 3.87–5.11)

## 2012-08-09 LAB — GLUCOSE, CAPILLARY: Glucose-Capillary: 108 mg/dL — ABNORMAL HIGH (ref 70–99)

## 2012-08-09 MED ORDER — INTEGRA PLUS PO CAPS: ORAL_CAPSULE | ORAL | Status: DC

## 2012-08-09 NOTE — Discharge Summary (Signed)
[facility] Discharge Summary  Name: Amanda Odonnell MRN: [id number] DOB: 03/12/1951 62 y.o. PCP:  Pearson Grippe, MD  Date of Admission: 08/08/2012  5:45 PM Date of Discharge: 08/09/2012 Attending Physician: Meryl Dare, MD  Discharge Diagnosis: Active Problems:   Postoperative aspiration pneumonitis   Consultations: Pulmonary Procedures Performed:  X-ray Chest Pa And Lateral   08/08/2012   *RADIOLOGY REPORT*  Clinical Data: Chest pain, cough query aspiration  CHEST - 2 VIEW  Comparison: None.  Findings: Mild patchy left basilar opacity best seen on the frontal view.  The right lower lobe appears clear.  Cardiac and mediastinal contours within normal limits.  Degenerative changes noted at both glenohumeral joints.  No acute osseous abnormality.  IMPRESSION: Patchy left basilar opacity best seen on the frontal view may reflect an acute infectious/inflammatory process such as pneumonia or aspiration in the appropriate clinical setting.   Original Report Authenticated By: Malachy Moan, M.D.   US Abdomen Complete  07/18/2012   *RADIOLOGY REPORT*  Clinical Data:  Weight loss.  History of hypertension, diabetes and hyper cholesterolemia.  COMPLETE ABDOMINAL ULTRASOUND  Comparison:  None  Findings:  Gallbladder: Well distended without wall thickening, stones or pericholecystic fluid. Negative sonographic Murphy's sign.  Common bile duct:   Normal in caliber without filling defects.  Liver:  The hepatic echogenicity appears mildly increased.  No contour or irregularity or focal abnormality is identified.  IVC:  Visualized portions appear unremarkable.  Pancreas:  Visualized portions appear unremarkable.Portions of the pancreatic head and tail are obscured by bowel gas.  Spleen:  Visualized portions appear unremarkable.  Right Kidney:   The renal cortical thickness and echogenicity are preserved.  There is no hydronephrosis or focal abnormality. Renal length is 11.2 cm.  Left Kidney:    The renal cortical thickness and echogenicity are preserved.  There is no hydronephrosis or focal abnormality. Renal length is 10.1 cm.  Abdominal aorta:  Atherosclerosis without evidence of aneurysm.  IMPRESSION:  1.  No acute abdominal findings identified. 2.  Mildly increased hepatic echogenicity, most typically secondary to steatosis.  No focal abnormality identified. 3.  Incomplete visualization of the pancreas.   Original Report Authenticated By: Carey Bullocks, M.D.   Mm Digital Screening  07/25/2012   *RADIOLOGY REPORT*  Clinical Data: Screening.  DIGITAL BILATERAL SCREENING MAMMOGRAM WITH CAD  Comparison:  Previous exams.  FINDINGS:  ACR Breast Density Category 2: There is a scattered fibroglandular pattern.  No suspicious masses, architectural distortion, or calcifications are present.  Images were processed with CAD.  IMPRESSION: No mammographic evidence of malignancy.  A result letter of this screening mammogram will be mailed directly to the patient.  RECOMMENDATION: Screening mammogram in one year. (Code:SM-B-01Y)  BI-RADS CATEGORY 1:  Negative.   Original Report Authenticated By: Harmon Pier, M.D.    GI Procedures: none-Colonoscopy was done out patient same day  History/Physical Exam:  See Admission H&P  Admission HPI: Amanda Odonnell is a 62 year old white female who underwent outpatient colonoscopy at the [facility] on 08/08/2012 for evaluation of iron deficiency anemia. Unfortunately she had vomiting while sedated with propofol and had an aspiration event with persistent hypoxemia post procedure. She was hemodynamically stable but O2 sats were persisting below 90 on room air. With nasal oxygen she was in the 90-92 range. She was admitted to the hospital on overnight observation with probable aspiration pneumonitis for chest x-ray oxygen saturation monitoring and nasal O2.  Hospital Course by problem list: Patient was admitted on overnight observation after  an aspiration event during colonoscopy done  at the [facility] on 08/08/2012 She had a very benign hospital course. Her oxygen sats stated above 90 with nasal oxygen. Initial WBC was elevated at 13,000. She had a low-grade temp in the 99 range on 08/09/2012. She had chest x-ray showing a left lower lobe opacity consistent with a possible aspiration pneumonitis. Following morning the patient felt fine denied any shortness of breath she was continuing to have some cough but was not producing any sputum. She was seen in consultation by pulmonary, Dr. Marchelle Gearing and was felt appropriate for discharge to home. She is felt to have a mild chemical pneumonitis and has not been discharged home on antibiotics . She will followup with [facility] practitioner in the pulmonary clinic on 08/16/2012 for followup chest x-ray and has been advised to call in the interim should she develop any fever chills shortness of breath worsening cough or sputum production. Her colonoscopy was negative. She will be treated with oral iron for 3-4 months and then should have repeat iron studies done by her primary care provider. If her iron deficiency is persisting can consider endoscopy and further GI workup at that time. She will followup otherwise with Dr. Russella Dar on an as needed basis.  Discharge Vitals:  BP 113/65  Pulse 82  Temp(Src) 99.1 F (37.3 C) (Oral)  Resp 18  Ht 5' (1.524 m)  Wt 154 lb (69.854 kg)  BMI 30.08 kg/m2  SpO2 92%  Discharge Labs:  Results for orders placed during the hospital encounter of 08/08/12 (from the past 24 hour(s))  GLUCOSE, CAPILLARY     Status: Abnormal   Collection Time    08/08/12  7:39 PM      Result Value Range   Glucose-Capillary 119 (*) 70 - 99 mg/dL  CBC     Status: Abnormal   Collection Time    08/08/12  7:56 PM      Result Value Range   WBC 13.2 (*) 4.0 - 10.5 K/uL   RBC 4.37  3.87 - 5.11 MIL/uL   Hemoglobin 10.3 (*) 12.0 - 15.0 g/dL   HCT 16.1 (*) 09.6 - 04.5 %   MCV 76.7 (*) 78.0 - 100.0 fL   MCH 23.6 (*) 26.0 - 34.0 pg    MCHC 30.7  30.0 - 36.0 g/dL   RDW 40.9  81.1 - 91.4 %   Platelets 334  150 - 400 K/uL  CREATININE, SERUM     Status: None   Collection Time    08/08/12  7:56 PM      Result Value Range   Creatinine, Ser 0.69  0.50 - 1.10 mg/dL   GFR calc non Af Amer >90  >90 mL/min   GFR calc Af Amer >90  >90 mL/min  GLUCOSE, CAPILLARY     Status: Abnormal   Collection Time    08/09/12  7:59 AM      Result Value Range   Glucose-Capillary 108 (*) 70 - 99 mg/dL   Comment 1 Notify RN    CBC     Status: Abnormal   Collection Time    08/09/12  9:51 AM      Result Value Range   WBC 16.1 (*) 4.0 - 10.5 K/uL   RBC 3.88  3.87 - 5.11 MIL/uL   Hemoglobin 9.0 (*) 12.0 - 15.0 g/dL   HCT 78.2 (*) 95.6 - 21.3 %   MCV 75.5 (*) 78.0 - 100.0 fL   MCH 23.2 (*) 26.0 -  34.0 pg   MCHC 30.7  30.0 - 36.0 g/dL   RDW 16.1  09.6 - 04.5 %   Platelets 312  150 - 400 K/uL    Disposition and follow-up:   Ms.Lyrika KATORA FINI was discharged from [facility] in stable condition.    Follow-up Appointments:  Future Appointments Provider Department Dept Phone   08/16/2012 11:00 AM Julio Sicks, NP [facility] [phone]      Discharge Medications:  Have given pt samples of Integra plus one daily- and will send RX for Integra plus one daily x 4 months   Medication List    TAKE these medications       albuterol 108 (90 BASE) MCG/ACT inhaler  Commonly known as:  PROVENTIL HFA;VENTOLIN HFA  Inhale 2 puffs into the lungs daily.     aspirin 81 MG tablet  Take 81 mg by mouth daily.     beta carotene 82956 UNIT capsule  Take 25,000 Units by mouth daily.     CALCIUM 500 PO  Take 1 tablet by mouth 2 (two) times daily.     cholecalciferol 1000 UNITS tablet  Commonly known as:  VITAMIN D  Take 1,000 Units by mouth daily.     Cinnamon 500 MG capsule  Take 500 mg by mouth daily.     Co Q 10 100 MG Caps  Take 1 capsule by mouth daily.     diphenhydrAMINE 25 mg capsule  Commonly known as:   BENADRYL  Take 25 mg by mouth every 6 (six) hours as needed for allergies.     Fish Oil 500 MG Caps  Take 1 capsule by mouth 2 (two) times daily.     Fluticasone-Salmeterol 100-50 MCG/DOSE Aepb  Commonly known as:  ADVAIR  Inhale 1 puff into the lungs 2 (two) times daily.     ibuprofen 200 MG tablet  Commonly known as:  ADVIL,MOTRIN  Take 200 mg by mouth every 6 (six) hours as needed for pain.     indapamide 2.5 MG tablet  Commonly known as:  LOZOL  Take 2.5 mg by mouth daily.     Magnesium 300 MG Caps  Take 1 capsule by mouth daily.     methocarbamol 500 MG tablet  Commonly known as:  ROBAXIN  Take 500 mg by mouth 4 (four) times daily.     montelukast 10 MG tablet  Commonly known as:  SINGULAIR  Take 10 mg by mouth at bedtime.     naproxen sodium 220 MG tablet  Commonly known as:  ANAPROX  Take 220 mg by mouth daily as needed (pain).     ranitidine 150 MG capsule  Commonly known as:  ZANTAC  Take 150 mg by mouth 2 (two) times daily.     rosuvastatin 40 MG tablet  Commonly known as:  CRESTOR  Take 20 mg by mouth at bedtime. Take 1/2 tablet once daily.     sertraline 100 MG tablet  Commonly known as:  ZOLOFT  Take 100 mg by mouth daily.     SIMBRINZA 1-0.2 % Susp  Generic drug:  Brinzolamide-Brimonidine  Place 1 drop into both eyes 2 (two) times daily.     TART CHERRY ADVANCED PO  Take by mouth as directed.     travoprost (benzalkonium) 0.004 % ophthalmic solution  Commonly known as:  TRAVATAN  Place 1 drop into both eyes at bedtime.     verapamil 360 MG 24 hr capsule  Commonly known as:  VERELAN PM  Take 360 mg by mouth every morning.        Signed: Mike Gip 08/09/2012, 1:22 PM

## 2012-08-09 NOTE — Telephone Encounter (Signed)
  Follow up Call-  Call back number 08/08/2012  Post procedure Call Back phone  # [phone]  Permission to leave phone message Yes     Patient questions:  Do you have a fever, pain , or abdominal swelling? no Pain Score  0 *  Have you tolerated food without any problems? yes  Have you been able to return to your normal activities? yes  Do you have any questions about your discharge instructions: Diet   no Medications  no Follow up visit  no  Do you have questions or concerns about your Care? no  Actions: * If pain score is 4 or above: No action needed, pain <4.  Patient transferred on discharge to [facility] room 1515. Called patient in her patient room. Patient stating "i am on the mend". Patient stating she is off 02, denies dyspnea or fever.

## 2012-08-09 NOTE — Telephone Encounter (Signed)
Message copied by Daphine Deutscher on Thu Aug 09, 2012  2:15 PM ------      Message from: [place], [place] S      Created: Thu Aug 09, 2012  1:50 PM      Regarding: Reen Lavona       Cesareo Vickrey- please send a prescription for Integra plus to this pt;s  Pharmacy  #30/4 refills one daily ------

## 2012-08-09 NOTE — Consult Note (Addendum)
PULMONARY  / CRITICAL CARE MEDICINE  Name: Amanda Odonnell MRN: [id number] DOB: [birth year]    ADMISSION DATE:  08/08/2012 CONSULTATION DATE:  08/09/12  REFERRING MD :  Dr. Russella Dar PRIMARY SERVICE:  GI  CHIEF COMPLAINT:  Aspiration  BRIEF PATIENT DESCRIPTION: 62 y/o F with PMH of Asthma and allergies who was admitted for GI evaluation to further work up anemia.  Underwent successful colonoscopy but then vomited with EGD with concern for aspiration.  PCCM consulted 5/29 for concern for aspiration.     SIGNIFICANT EVENTS / STUDIES:  5/28 - Admit for Colonoscopy   CULTURES:   ANTIBIOTICS:   HISTORY OF PRESENT ILLNESS:  62 y/o F with PMH of HTN, HLD, DM, Anxiety,  Asthma and allergies who was admitted for GI evaluation to further work up iron deficiency anemia.  Underwent successful colonoscopy but then vomited with EGD with concern for aspiration.  PCCM consulted 5/29 for concern for aspiration.     Patient reports she feels much better today (5/29).  Last pm had episode of increased temp to 99.1, few chills and cough.  Cough has improved this am with less frequency and she feels better than previous PM.     PAST MEDICAL HISTORY :  Past Medical History  Diagnosis Date  . Hypertension   . Hyperlipidemia   . Glucose intolerance (impaired glucose tolerance)   . Asthma   . Allergic rhinitis   . Vitamin D deficiency   . Osteoporosis   . Osteoarthritis   . Stress fracture of hip     femeur of right hip  . Anemia   . COPD (chronic obstructive pulmonary disease)   . DM (diabetes mellitus)   . Anxiety   . Glaucoma    Past Surgical History  Procedure Laterality Date  . Tonsillectomy and adenoidectomy    . Tongue biopsy    . Wisdom tooth extraction    . Elbow surgery Right     plates and pins  . Colonoscopy  08/08/12   Prior to Admission medications   Medication Sig Start Date End Date Taking? Authorizing Provider  albuterol (PROVENTIL HFA;VENTOLIN HFA) 108 (90 BASE) MCG/ACT  inhaler Inhale 2 puffs into the lungs daily.   Yes Historical Provider, MD  aspirin 81 MG tablet Take 81 mg by mouth daily.   Yes Historical Provider, MD  beta carotene 40981 UNIT capsule Take 25,000 Units by mouth daily.   Yes Historical Provider, MD  Brinzolamide-Brimonidine [facility]) 1-0.2 % SUSP Place 1 drop into both eyes 2 (two) times daily.   Yes Historical Provider, MD  Calcium-Magnesium-Vitamin D (CALCIUM 500 PO) Take 1 tablet by mouth 2 (two) times daily.   Yes Historical Provider, MD  cholecalciferol (VITAMIN D) 1000 UNITS tablet Take 1,000 Units by mouth daily.   Yes Historical Provider, MD  Cinnamon 500 MG capsule Take 500 mg by mouth daily.   Yes Historical Provider, MD  Coenzyme Q10 (CO Q 10) 100 MG CAPS Take 1 capsule by mouth daily.   Yes Historical Provider, MD  diphenhydrAMINE (BENADRYL) 25 mg capsule Take 25 mg by mouth every 6 (six) hours as needed for allergies.   Yes Historical Provider, MD  Fluticasone-Salmeterol (ADVAIR) 100-50 MCG/DOSE AEPB Inhale 1 puff into the lungs 2 (two) times daily.   Yes Historical Provider, MD  ibuprofen (ADVIL,MOTRIN) 200 MG tablet Take 200 mg by mouth every 6 (six) hours as needed for pain.   Yes Historical Provider, MD  indapamide (LOZOL) 2.5 MG tablet Take 2.5  mg by mouth daily.   Yes Historical Provider, MD  Magnesium 300 MG CAPS Take 1 capsule by mouth daily.   Yes Historical Provider, MD  methocarbamol (ROBAXIN) 500 MG tablet Take 500 mg by mouth 4 (four) times daily.   Yes Historical Provider, MD  Misc Natural Products (TART CHERRY ADVANCED PO) Take by mouth as directed.   Yes Historical Provider, MD  montelukast (SINGULAIR) 10 MG tablet Take 10 mg by mouth at bedtime.   Yes Historical Provider, MD  naproxen sodium (ANAPROX) 220 MG tablet Take 220 mg by mouth daily as needed (pain).   Yes Historical Provider, MD  Omega-3 Fatty Acids (FISH OIL) 500 MG CAPS Take 1 capsule by mouth 2 (two) times daily.   Yes Historical Provider, MD   ranitidine (ZANTAC) 150 MG capsule Take 150 mg by mouth 2 (two) times daily.   Yes Historical Provider, MD  rosuvastatin (CRESTOR) 40 MG tablet Take 20 mg by mouth at bedtime. Take 1/2 tablet once daily.   Yes Historical Provider, MD  sertraline (ZOLOFT) 100 MG tablet Take 100 mg by mouth daily.   Yes Historical Provider, MD  travoprost, benzalkonium, (TRAVATAN) 0.004 % ophthalmic solution Place 1 drop into both eyes at bedtime.   Yes Historical Provider, MD  verapamil (VERELAN PM) 360 MG 24 hr capsule Take 360 mg by mouth every morning.   Yes Historical Provider, MD   Allergies  Allergen Reactions  . Sulfa Antibiotics Hives    FAMILY HISTORY:  Family History  Problem Relation Age of Onset  . Heart attack Father   . CVA Father   . Diabetes type II Father   . Ulcerative colitis Father   . Heart disease Paternal Grandmother   . Stroke Maternal Grandmother   . AAA (abdominal aortic aneurysm) Maternal Grandmother   . Breast cancer Maternal Aunt     and great aunt  . Melanoma Mother   . Hiatal hernia Mother     with part of stomach removed  . Clotting disorder Mother   . Anemia Mother   . Hemophilia Maternal Uncle   . Crohn's disease Maternal Aunt    SOCIAL HISTORY:  reports that she has never smoked. She has never used smokeless tobacco. She reports that she does not drink alcohol or use illicit drugs.  REVIEW OF SYSTEMS:   Constitutional: Negative for weight loss, malaise/fatigue and diaphoresis. Reports low grade fever overnight, few chills.   HENT: Negative for hearing loss, ear pain, nosebleeds, congestion, sore throat, neck pain, tinnitus and ear discharge.   Eyes: Negative for blurred vision, double vision, photophobia, pain, discharge and redness.  Respiratory: Negative for hemoptysis, sputum production, shortness of breath, wheezing and stridor.  Reports non-productive cough since last pm.  Cardiovascular: Negative for chest pain, palpitations, orthopnea, claudication,  leg swelling and PND.  Gastrointestinal: Negative for heartburn, nausea, vomiting, abdominal pain, diarrhea, constipation, blood in stool and melena.  Genitourinary: Negative for dysuria, urgency, frequency, hematuria and flank pain.  Musculoskeletal: Negative for myalgias, back pain, joint pain and falls.  Skin: Negative for itching and rash.  Neurological: Negative for dizziness, tingling, tremors, sensory change, speech change, focal weakness, seizures, loss of consciousness, weakness and headaches.  Endo/Heme/Allergies: Negative for environmental allergies and polydipsia. Does not bruise/bleed easily.  SUBJECTIVE: "I feel better"  VITAL SIGNS: Temp:  [98.2 F (36.8 C)-99.1 F (37.3 C)] 99.1 F (37.3 C) (05/29 0445) Pulse Rate:  [36-106] 82 (05/29 0445) Resp:  [0-28] 18 (05/29 0445) BP: (111-151)/(58-100) 113/65  mmHg (05/29 0445) SpO2:  [88 %-100 %] 92 % (05/29 0445) Weight:  [154 lb (69.854 kg)] 154 lb (69.854 kg) (05/28 1900)  PHYSICAL EXAMINATION: General:  wdwn adult female in NAD Neuro:  AAOx4, speech clear, MAE HEENT:  Mm pink/moist, no jvd Cardiovascular:  s1s2 rrr, no m/r/g Lungs:  resp's even/non-labored, R lung clear, L with few fine crackles that do not clear with cough Abdomen:  Round/soft, bsx4 active Musculoskeletal:  No acute deformities Skin:  Warm/dry   Recent Labs Lab 08/08/12 1956  CREATININE 0.69    Recent Labs Lab 08/08/12 1956 08/09/12 0951  HGB 10.3* 9.0*  HCT 33.5* 29.3*  WBC 13.2* 16.1*  PLT 334 312   X-ray Chest Pa And Lateral   08/08/2012   *RADIOLOGY REPORT*  Clinical Data: Chest pain, cough query aspiration  CHEST - 2 VIEW  Comparison: None.  Findings: Mild patchy left basilar opacity best seen on the frontal view.  The right lower lobe appears clear.  Cardiac and mediastinal contours within normal limits.  Degenerative changes noted at both glenohumeral joints.  No acute osseous abnormality.  IMPRESSION: Patchy left basilar opacity best  seen on the frontal view may reflect an acute infectious/inflammatory process such as pneumonia or aspiration in the appropriate clinical setting.   Original Report Authenticated By: Malachy Moan, M.D.    ASSESSMENT / PLAN:   LLL Aspiration postoperative aspiration pneumonitis LLL aspiration even with EGD.  Patient had been NPO prior to procedure and likely aspirated gastric content / bile.  Elevation of WBC and mild fever likely reactive to chemical injury of aspiration.    Plan: -hold abx -follow up in office with Rubye Oaks, NP for follow up cxr to review LLL opacity (arranged) -ok for discharge -discussed worsening of symptoms with patient and she indicates verbal understanding  -she has home amoxicillin Rx from previous dental work that she has not filled (could fill if needed).  Have asked her to call the office if she has worsening of symptoms.    Canary Brim, NP-C [facility] Pgr: [phone] or [phone]    08/09/2012, 11:52 AM  STAFF NOTE: I, Dr Lavinia Sharps have personally reviewed patient's available data, including medical history, events of note, physical examination and test results as part of my evaluation. I have discussed with resident/NP and other care providers such as pharmacist, RN and RRT.  In addition,  I personally evaluated patient and elicited key findings of clinical profile fits in with chemical pneumonitis of the left lower lobe due to aspiration of gastric contents specifically gastric juice after being n.p.o. Her max temperature is only 99 Fahrenheit. White count is going up and reflects reaction to this chemical pneumonitis. There is no evidence of infection as evidenced by cough, colored sputum, fever, chills. Therefore it is best to follow this expectantly and start antibiotic therapy if she develops worsening respiratory or infectious symptoms. I have educated her personally how to watch for these symptoms and to call our office at  [facility] pulmonary if these were to develop to start antibiotic therapy. Of note, she does have a prescription of amoxicillin that is on filled and is related to tooth extraction. She could always use this if needed for her lungs as well.  Rest per NP/medical resident whose note is outlined above and that I agree with     Dr. Kalman Shan, M.D., [facility].C.P Pulmonary and Critical Care Medicine Staff Physician [facility]  Pulmonary and Critical Care Pager:  [phone], If no answer or between  15:00h - 7:00h: call [phone]  08/09/2012 12:44 PM

## 2012-08-09 NOTE — Progress Notes (Addendum)
Patient ID: Amanda Odonnell, female   DOB: 12-Feb-1951, 62 y.o.   MRN: [id number] [facility] Progress Note  Subjective: Feels OK-denies SOB-has been coughing.  CXR- patchy left basilar opacity ?aspiration pneumonia  Objective:  Vital signs in last 24 hours: Temp:  [98.2 F (36.8 C)-99.1 F (37.3 C)] 99.1 F (37.3 C) (05/29 0445) Pulse Rate:  [36-106] 82 (05/29 0445) Resp:  [0-28] 18 (05/29 0445) BP: (111-151)/(58-100) 113/65 mmHg (05/29 0445) SpO2:  [88 %-100 %] 92 % (05/29 0445) Weight:  [154 lb (69.854 kg)] 154 lb (69.854 kg) (05/28 1900) Last BM Date: 08/08/12 General:   Alert,  Well-developed,    in NAD in bathroom taking a bath- not examined  Alert and cooperative. Normal mood and affect.  Lab Results:  Recent Labs  08/08/12 1956  WBC 13.2*  HGB 10.3*  HCT 33.5*  PLT 334   BMET  Recent Labs  08/08/12 1956  CREATININE 0.69      Assessment / Plan: #1  61 yo female with aspiration event during colonoscopy yesterday. Pt is stable but WBC 13, and may having a LLL pneumonia brewing-  On Zosyn- will ask pulmonary to see her and advise regarding regimen, and whether needs abx Possible discharge home later today if OK with pulmonary-pt see Dr. Maple Hudson for COPD #2 fe deficiency anemia- negative colon 5/28. EGD not done- wil laddress as outpt.. #3 HTN       LOS: 1 day   Amanda Odonnell  08/09/2012, 9:08 AM   ________________________________________________________________________  Amanda Odonnell GI MD note:  I personally examined the patient, reviewed the data and agree with the assessment and plan described above.  She is breathing comfortably.  Feels much better than yesterday.  No abd pain, eating well.  Walking in room.  I agree that she can be sent home if OK with pulmonary, doubt she will need abx but will defer tthat to pulmonary as well.  Just noted by RN, she is not on zosyn and has not been on zosyn.  I think that is OK, would defer to  pulmonary.   Amanda Bunting, MD [facility] Pager [phone]

## 2012-08-09 NOTE — Telephone Encounter (Signed)
Rx sent to [facility]. Left a message for patient that rx has been sent.

## 2012-08-13 ENCOUNTER — Telehealth: Payer: Self-pay | Admitting: *Deleted

## 2012-08-13 NOTE — Telephone Encounter (Signed)
I called Amanda Odonnell.  I advised them that I have samples of Integra Plus Cap for the patient, 1 months worth.  I then called the pt to advise her of this and told her to call me when she is close to being out and I will see if we have more samples. If not, She can get something over the counter that is cheaper that has 125 mg of iron. Her cash price is $23.35.  She will come to our office to pick up the samples.

## 2012-08-16 ENCOUNTER — Ambulatory Visit (INDEPENDENT_AMBULATORY_CARE_PROVIDER_SITE_OTHER)
Admission: RE | Admit: 2012-08-16 | Discharge: 2012-08-16 | Disposition: A | Discharge status: Home / Self Care | Payer: 59 | Source: Ambulatory Visit | Attending: Adult Health | Admitting: Adult Health

## 2012-08-16 ENCOUNTER — Encounter: Payer: Self-pay | Admitting: Adult Health

## 2012-08-16 ENCOUNTER — Ambulatory Visit (INDEPENDENT_AMBULATORY_CARE_PROVIDER_SITE_OTHER): Payer: 59 | Admitting: Adult Health

## 2012-08-16 VITALS — BP 130/88 | HR 79 | Temp 97.3°F | Ht 60.0 in | Wt 154.2 lb

## 2012-08-16 DIAGNOSIS — J954 Chemical pneumonitis due to anesthesia: Secondary | ICD-10-CM

## 2012-08-16 NOTE — Progress Notes (Signed)
  Subjective:    Patient ID: Amanda Odonnell, female    DOB: [birth year], 62 y.o.   MRN: [id number]  HPI  08/16/2012 [facility] follow up  Amanda Odonnell is a 62 year old white female who underwent outpatient colonoscopy on 08/08/2012 for evaluation of iron deficiency anemia. Unfortunately she had vomiting while sedated with propofol and had an aspiration event with persistent hypoxemia post procedure. She was hemodynamically stable but O2 sats were persisting below 90 on room air. Requiring admission.  Admitted on 08/08/12 showing  a left lower lobe opacity consistent with a possible aspiration pneumonitis.  Felt to have a mild chemical pneumonitis   CXR today shows clearance on xray . She is feeling better with no cough or dyspnea. Does feel weak but better than she did.  No hemoptysis, chest pain , edema or n/v.    Review of Systems Constitutional:   No  weight loss, night sweats,  Fevers, chills,  +fatigue, or  lassitude.  HEENT:   No headaches,  Difficulty swallowing,  Tooth/dental problems, or  Sore throat,                No sneezing, itching, ear ache, nasal congestion, post nasal drip,   CV:  No chest pain,  Orthopnea, PND, swelling in lower extremities, anasarca, dizziness, palpitations, syncope.   GI  No heartburn, indigestion, abdominal pain, nausea, vomiting, diarrhea, change in bowel habits, loss of appetite, bloody stools.   Resp: No shortness of breath with exertion or at rest.  No excess mucus, no productive cough,  No non-productive cough,  No coughing up of blood.  No change in color of mucus.  No wheezing.  No chest wall deformity  Skin: no rash or lesions.  GU: no dysuria, change in color of urine, no urgency or frequency.  No flank pain, no hematuria   MS:  No joint pain or swelling.  No decreased range of motion.  No back pain.  Psych:  No change in mood or affect. No depression or anxiety.  No memory loss.         Objective:   Physical Exam GEN: A/Ox3; pleasant ,  NAD, well nourished   HEENT:  [place]/AT,  EACs-clear, TMs-wnl, NOSE-clear, THROAT-clear, no lesions, no postnasal drip or exudate noted.   NECK:  Supple w/ fair ROM; no JVD; normal carotid impulses w/o bruits; no thyromegaly or nodules palpated; no lymphadenopathy.  RESP  Clear  P & A; w/o, wheezes/ rales/ or rhonchi.no accessory muscle use, no dullness to percussion  CARD:  RRR, no m/r/g  , no peripheral edema, pulses intact, no cyanosis or clubbing.  GI:   Soft & nt; nml bowel sounds; no organomegaly or masses detected.  Musco: Warm bil, no deformities or joint swelling noted.   Neuro: alert, no focal deficits noted.    Skin: Warm, no lesions or rashes  CXR : Patchy infiltrate noted recently in the left base has  cleared. Currently, lungs are clear       Assessment & Plan:

## 2012-08-16 NOTE — Patient Instructions (Addendum)
Your xray has cleared.  Advance your activity as tolerated.  follow up with our office As needed

## 2012-08-17 NOTE — Assessment & Plan Note (Signed)
Resolved on cxr  follow up with pulmonary As needed

## 2012-08-29 ENCOUNTER — Telehealth: Payer: Self-pay | Admitting: *Deleted

## 2012-08-29 NOTE — Telephone Encounter (Signed)
Per [name] PA-C, the patient is to take the Integra iron supp 125 mg Elemental iron.  The pharmacy sent [place] a fax stating the insurance company won't cover this Integra. The cash price is $23.35 a month for 1 tab daily.I called the patient to let her know Amy is suggesting she still take the Integra that has vitamin C with it to help her absorb the iron.  I let her know a cheaper brand may not have a sufficient amount of iron and she may not absorb it as well. She said she thinks it is helping, she sees a difference in how she feels.  I offered her more samples. I put them at the front desk. She said she will come pick them up early next week.

## 2012-11-16 ENCOUNTER — Telehealth: Payer: Self-pay | Admitting: Gastroenterology

## 2012-11-16 DIAGNOSIS — D509 Iron deficiency anemia, unspecified: Secondary | ICD-10-CM

## 2012-11-16 MED ORDER — INTEGRA PLUS PO CAPS: ORAL_CAPSULE | ORAL | Status: DC

## 2012-11-16 NOTE — Telephone Encounter (Signed)
rx sent and orders for CBC entered.   I have left a detailed message for the patient

## 2012-11-19 ENCOUNTER — Other Ambulatory Visit (INDEPENDENT_AMBULATORY_CARE_PROVIDER_SITE_OTHER): Payer: 59

## 2012-11-19 DIAGNOSIS — D509 Iron deficiency anemia, unspecified: Secondary | ICD-10-CM

## 2012-11-19 LAB — CBC WITH DIFFERENTIAL/PLATELET
Basophils Relative: 0.6 % (ref 0.0–3.0)
HCT: 36 % (ref 36.0–46.0)
Hemoglobin: 12.2 g/dL (ref 12.0–15.0)
Lymphocytes Relative: 17.6 % (ref 12.0–46.0)
Lymphs Abs: 1.2 10*3/uL (ref 0.7–4.0)
MCHC: 33.8 g/dL (ref 30.0–36.0)
Monocytes Relative: 8.4 % (ref 3.0–12.0)
Neutro Abs: 4.5 10*3/uL (ref 1.4–7.7)
RBC: 4.49 Mil/uL (ref 3.87–5.11)

## 2013-01-17 ENCOUNTER — Other Ambulatory Visit: Payer: Self-pay

## 2013-07-05 DIAGNOSIS — M5126 Other intervertebral disc displacement, lumbar region: Secondary | ICD-10-CM | POA: Diagnosis not present

## 2013-07-23 DIAGNOSIS — M19019 Primary osteoarthritis, unspecified shoulder: Secondary | ICD-10-CM | POA: Diagnosis not present

## 2013-09-17 DIAGNOSIS — E119 Type 2 diabetes mellitus without complications: Secondary | ICD-10-CM | POA: Diagnosis not present

## 2013-10-18 DIAGNOSIS — E119 Type 2 diabetes mellitus without complications: Secondary | ICD-10-CM | POA: Diagnosis not present

## 2013-10-18 DIAGNOSIS — E559 Vitamin D deficiency, unspecified: Secondary | ICD-10-CM | POA: Diagnosis not present

## 2013-10-18 DIAGNOSIS — I10 Essential (primary) hypertension: Secondary | ICD-10-CM | POA: Diagnosis not present

## 2013-10-25 DIAGNOSIS — E7800 Pure hypercholesterolemia, unspecified: Secondary | ICD-10-CM | POA: Diagnosis not present

## 2013-10-25 DIAGNOSIS — J45909 Unspecified asthma, uncomplicated: Secondary | ICD-10-CM | POA: Diagnosis not present

## 2013-10-25 DIAGNOSIS — I10 Essential (primary) hypertension: Secondary | ICD-10-CM | POA: Diagnosis not present

## 2013-10-25 DIAGNOSIS — E119 Type 2 diabetes mellitus without complications: Secondary | ICD-10-CM | POA: Diagnosis not present

## 2013-11-29 DIAGNOSIS — Z23 Encounter for immunization: Secondary | ICD-10-CM | POA: Diagnosis not present

## 2014-01-02 DIAGNOSIS — M7061 Trochanteric bursitis, right hip: Secondary | ICD-10-CM | POA: Diagnosis not present

## 2014-01-10 ENCOUNTER — Other Ambulatory Visit: Payer: Self-pay

## 2014-01-10 DIAGNOSIS — Z1231 Encounter for screening mammogram for malignant neoplasm of breast: Secondary | ICD-10-CM

## 2014-01-13 ENCOUNTER — Ambulatory Visit
Admission: RE | Admit: 2014-01-13 | Discharge: 2014-01-13 | Disposition: A | Discharge status: Home / Self Care | Payer: Medicare Other | Source: Ambulatory Visit

## 2014-01-13 DIAGNOSIS — Z1231 Encounter for screening mammogram for malignant neoplasm of breast: Secondary | ICD-10-CM

## 2014-01-14 DIAGNOSIS — H4011X1 Primary open-angle glaucoma, mild stage: Secondary | ICD-10-CM | POA: Diagnosis not present

## 2014-01-14 DIAGNOSIS — Q150 Congenital glaucoma: Secondary | ICD-10-CM | POA: Diagnosis not present

## 2014-01-24 DIAGNOSIS — I10 Essential (primary) hypertension: Secondary | ICD-10-CM | POA: Diagnosis not present

## 2014-01-24 DIAGNOSIS — E118 Type 2 diabetes mellitus with unspecified complications: Secondary | ICD-10-CM | POA: Diagnosis not present

## 2014-01-24 DIAGNOSIS — M810 Age-related osteoporosis without current pathological fracture: Secondary | ICD-10-CM | POA: Diagnosis not present

## 2014-01-31 DIAGNOSIS — I10 Essential (primary) hypertension: Secondary | ICD-10-CM | POA: Diagnosis not present

## 2014-01-31 DIAGNOSIS — E780 Pure hypercholesterolemia: Secondary | ICD-10-CM | POA: Diagnosis not present

## 2014-01-31 DIAGNOSIS — E119 Type 2 diabetes mellitus without complications: Secondary | ICD-10-CM | POA: Diagnosis not present

## 2014-01-31 DIAGNOSIS — D649 Anemia, unspecified: Secondary | ICD-10-CM | POA: Diagnosis not present

## 2014-02-03 DIAGNOSIS — M7061 Trochanteric bursitis, right hip: Secondary | ICD-10-CM | POA: Diagnosis not present

## 2014-02-03 DIAGNOSIS — M5136 Other intervertebral disc degeneration, lumbar region: Secondary | ICD-10-CM | POA: Diagnosis not present

## 2014-02-18 DIAGNOSIS — M5136 Other intervertebral disc degeneration, lumbar region: Secondary | ICD-10-CM | POA: Diagnosis not present

## 2014-02-26 ENCOUNTER — Encounter: Payer: Self-pay | Admitting: Gastroenterology

## 2014-02-26 ENCOUNTER — Ambulatory Visit (INDEPENDENT_AMBULATORY_CARE_PROVIDER_SITE_OTHER): Payer: Medicare Other | Admitting: Gastroenterology

## 2014-02-26 ENCOUNTER — Other Ambulatory Visit (INDEPENDENT_AMBULATORY_CARE_PROVIDER_SITE_OTHER): Payer: Medicare Other

## 2014-02-26 VITALS — BP 118/60 | HR 84 | Ht 61.0 in | Wt 151.8 lb

## 2014-02-26 DIAGNOSIS — D509 Iron deficiency anemia, unspecified: Secondary | ICD-10-CM

## 2014-02-26 DIAGNOSIS — K219 Gastro-esophageal reflux disease without esophagitis: Secondary | ICD-10-CM

## 2014-02-26 LAB — IGA: IgA: 141 mg/dL (ref 68–378)

## 2014-02-26 LAB — FOLATE

## 2014-02-26 LAB — IBC PANEL
Iron: 18 ug/dL — ABNORMAL LOW (ref 42–145)
Saturation Ratios: 3.6 % — ABNORMAL LOW (ref 20.0–50.0)
Transferrin: 357.6 mg/dL (ref 212.0–360.0)

## 2014-02-26 LAB — VITAMIN B12: VITAMIN B 12: 718 pg/mL (ref 211–911)

## 2014-02-26 LAB — IRON: Iron: 18 ug/dL — ABNORMAL LOW (ref 42–145)

## 2014-02-26 LAB — FERRITIN: Ferritin: 6.9 ng/mL — ABNORMAL LOW (ref 10.0–291.0)

## 2014-02-26 NOTE — Progress Notes (Signed)
History of Present Illness: This is a 63 year old female with a recurrent anemia referred by Dr. Selena BattenKim. She underwent colonoscopy in May 2014 for evaluation of iron deficiency anemia and her colonoscopy was unremarkable. Upper endoscopy scheduled the same day as her colonoscopy was canceled due to an aspiration event associated with her colonoscopy. She was hospitalized briefly for an aspiration pneumonitis and was discharged without any residual problems. Her hemoglobin was normal at 13.1 with an MCV of 84 in January 2015. Current hemoglobin is 9.1 with an MCV of 78. She takes frequent NSAIDs for back pain. She has chronic GERD which is fairly well-controlled on ranitidine twice daily.   Allergies  Allergen Reactions  . Sulfa Antibiotics Hives   Outpatient Prescriptions Prior to Visit  Medication Sig Dispense Refill  . albuterol (PROVENTIL HFA;VENTOLIN HFA) 108 (90 BASE) MCG/ACT inhaler Inhale 2 puffs into the lungs daily.    Marland Kitchen. aspirin 81 MG tablet Take 81 mg by mouth daily.    . beta carotene 4098125000 UNIT capsule Take 25,000 Units by mouth daily.    . Calcium-Magnesium-Vitamin D (CALCIUM 500 PO) Take 1 tablet by mouth 2 (two) times daily.    . cholecalciferol (VITAMIN D) 1000 UNITS tablet Take 1,000 Units by mouth daily.    . Cinnamon 500 MG capsule Take 500 mg by mouth daily.    . Coenzyme Q10 (CO Q 10) 100 MG CAPS Take 1 capsule by mouth daily.    . diphenhydrAMINE (BENADRYL) 25 mg capsule Take 25 mg by mouth every 6 (six) hours as needed for allergies.    . Fluticasone-Salmeterol (ADVAIR) 100-50 MCG/DOSE AEPB Inhale 1 puff into the lungs 2 (two) times daily.    Marland Kitchen. ibuprofen (ADVIL,MOTRIN) 200 MG tablet Take 200 mg by mouth every 6 (six) hours as needed for pain.    . indapamide (LOZOL) 2.5 MG tablet Take 2.5 mg by mouth daily.    . Magnesium 300 MG CAPS Take 1 capsule by mouth daily.    . methocarbamol (ROBAXIN) 500 MG tablet Take 500 mg by mouth 4 (four) times daily.    . Misc Natural  Products (TART CHERRY ADVANCED PO) Take by mouth as directed.    . montelukast (SINGULAIR) 10 MG tablet Take 10 mg by mouth at bedtime.    . naproxen sodium (ANAPROX) 220 MG tablet Take 220 mg by mouth daily as needed (pain).    . Omega-3 Fatty Acids (FISH OIL) 500 MG CAPS Take 1 capsule by mouth 2 (two) times daily.    . ranitidine (ZANTAC) 150 MG capsule Take 150 mg by mouth 2 (two) times daily.    . rosuvastatin (CRESTOR) 40 MG tablet Take 20 mg by mouth at bedtime. Take 1/2 tablet once daily.    . sertraline (ZOLOFT) 100 MG tablet Take 100 mg by mouth daily.    . verapamil (VERELAN PM) 360 MG 24 hr capsule Take 360 mg by mouth every morning.    Marland Kitchen. amoxicillin (AMOXIL) 500 MG capsule Take 500 mg by mouth 3 (three) times daily. For dental procedure.    . Brinzolamide-Brimonidine (SIMBRINZA) 1-0.2 % SUSP Place 1 drop into both eyes 2 (two) times daily.    Marland Kitchen. FeFum-FePoly-FA-B Cmp-C-Biot (INTEGRA PLUS) CAPS Take one po daily 30 capsule 4  . travoprost, benzalkonium, (TRAVATAN) 0.004 % ophthalmic solution Place 1 drop into both eyes at bedtime.     No facility-administered medications prior to visit.   Past Medical History  Diagnosis Date  . Hypertension   .  Hyperlipidemia   . Glucose intolerance (impaired glucose tolerance)   . Asthma   . Allergic rhinitis   . Vitamin D deficiency   . Osteoporosis   . Osteoarthritis   . Stress fracture of hip     femeur of right hip  . Anemia   . DM (diabetes mellitus)   . Anxiety   . Glaucoma    Past Surgical History  Procedure Laterality Date  . Tonsillectomy and adenoidectomy    . Tongue biopsy    . Wisdom tooth extraction    . Elbow surgery Right     plates and pins  . Colonoscopy  08/08/12   History   Social History  . Marital Status: Single    Spouse Name: N/A    Number of Children: 0  . Years of Education: N/A   Occupational History  . retired [employer]   Social History Main Topics  . Smoking status: Never Smoker   .  Smokeless tobacco: Never Used  . Alcohol Use: No  . Drug Use: No  . Sexual Activity: None   Other Topics Concern  . None   Social History Narrative   Family History  Problem Relation Age of Onset  . Heart attack Father   . CVA Father   . Diabetes type II Father   . Ulcerative colitis Father   . Heart disease Paternal Grandmother   . Stroke Maternal Grandmother   . AAA (abdominal aortic aneurysm) Maternal Grandmother   . Breast cancer Maternal Aunt     and great aunt  . Melanoma Mother   . Hiatal hernia Mother     with part of stomach removed  . Clotting disorder Mother   . Anemia Mother   . Hemophilia Maternal Uncle   . Crohn's disease Maternal Aunt     Physical Exam: General: Well developed , well nourished, no acute distress Head: Normocephalic and atraumatic Eyes:  sclerae anicteric, EOMI Ears: Normal auditory acuity Mouth: No deformity or lesions Lungs: Clear throughout to auscultation Heart: Regular rate and rhythm; no murmurs, rubs or bruits Abdomen: Soft, non tender and non distended. No masses, hepatosplenomegaly or hernias noted. Normal Bowel sounds Musculoskeletal: Symmetrical with no gross deformities  Pulses:  Normal pulses noted Extremities: No clubbing, cyanosis, edema or deformities noted Neurological: Alert oriented x 4, grossly nonfocal Psychological:  Alert and cooperative. Normal mood and affect  Assessment and Recommendations:  1. Fe deficiency anemia. Frequent NSAID usage. Normal colonoscopy in 2014. R/O ulcer, gastritis, duodenitis, AVMs, upper gastrointestinal tract neoplasms, celiac disease and poor iron absorption. Avoid or minimize NSAID usage. Check tTG and IgA. Check stool hemoccults. Check fe studies, B12 and folate. Begin Fe bid with meals. The patient declines to proceed with EGD as recommended given aspiration event after colonoscopy last year. Consider UGI series and reconsider EGD at [facility].   2. GERD. Continue standard antireflux  measures and ranitidine 150 mg twice daily.

## 2014-02-26 NOTE — Patient Instructions (Addendum)
We have given you hemoccult cards to complete. Your physician has requested that you go to the basement for lab work before leaving today. Start Iron twice daily with meals after completing hemoccult cards.  cc:  Pearson GrippeJames Kim MD

## 2014-02-27 LAB — TISSUE TRANSGLUTAMINASE, IGA: TISSUE TRANSGLUTAMINASE AB, IGA: 1 U/mL (ref <4)

## 2014-03-03 DIAGNOSIS — M5136 Other intervertebral disc degeneration, lumbar region: Secondary | ICD-10-CM | POA: Diagnosis not present

## 2014-03-18 DIAGNOSIS — M5136 Other intervertebral disc degeneration, lumbar region: Secondary | ICD-10-CM | POA: Diagnosis not present

## 2014-03-21 ENCOUNTER — Other Ambulatory Visit (INDEPENDENT_AMBULATORY_CARE_PROVIDER_SITE_OTHER): Payer: Medicare Other

## 2014-03-21 DIAGNOSIS — D509 Iron deficiency anemia, unspecified: Secondary | ICD-10-CM

## 2014-03-21 LAB — HEMOCCULT SLIDES (X 3 CARDS)
Fecal Occult Blood: NEGATIVE
OCCULT 1: NEGATIVE
OCCULT 2: NEGATIVE
OCCULT 3: NEGATIVE
OCCULT 4: NEGATIVE
OCCULT 5: NEGATIVE

## 2014-04-30 DIAGNOSIS — E559 Vitamin D deficiency, unspecified: Secondary | ICD-10-CM | POA: Diagnosis not present

## 2014-04-30 DIAGNOSIS — D649 Anemia, unspecified: Secondary | ICD-10-CM | POA: Diagnosis not present

## 2014-04-30 DIAGNOSIS — E119 Type 2 diabetes mellitus without complications: Secondary | ICD-10-CM | POA: Diagnosis not present

## 2014-04-30 DIAGNOSIS — E780 Pure hypercholesterolemia: Secondary | ICD-10-CM | POA: Diagnosis not present

## 2014-05-05 DIAGNOSIS — E780 Pure hypercholesterolemia: Secondary | ICD-10-CM | POA: Diagnosis not present

## 2014-05-05 DIAGNOSIS — D649 Anemia, unspecified: Secondary | ICD-10-CM | POA: Diagnosis not present

## 2014-05-05 DIAGNOSIS — I10 Essential (primary) hypertension: Secondary | ICD-10-CM | POA: Diagnosis not present

## 2014-05-05 DIAGNOSIS — E118 Type 2 diabetes mellitus with unspecified complications: Secondary | ICD-10-CM | POA: Diagnosis not present

## 2014-05-26 DIAGNOSIS — H4011X1 Primary open-angle glaucoma, mild stage: Secondary | ICD-10-CM | POA: Diagnosis not present

## 2014-05-26 DIAGNOSIS — H4010X1 Unspecified open-angle glaucoma, mild stage: Secondary | ICD-10-CM | POA: Diagnosis not present

## 2014-07-28 DIAGNOSIS — I10 Essential (primary) hypertension: Secondary | ICD-10-CM | POA: Diagnosis not present

## 2014-07-28 DIAGNOSIS — E559 Vitamin D deficiency, unspecified: Secondary | ICD-10-CM | POA: Diagnosis not present

## 2014-07-28 DIAGNOSIS — D649 Anemia, unspecified: Secondary | ICD-10-CM | POA: Diagnosis not present

## 2014-08-04 DIAGNOSIS — E780 Pure hypercholesterolemia: Secondary | ICD-10-CM | POA: Diagnosis not present

## 2014-08-04 DIAGNOSIS — E119 Type 2 diabetes mellitus without complications: Secondary | ICD-10-CM | POA: Diagnosis not present

## 2014-08-04 DIAGNOSIS — I10 Essential (primary) hypertension: Secondary | ICD-10-CM | POA: Diagnosis not present

## 2014-08-04 DIAGNOSIS — E559 Vitamin D deficiency, unspecified: Secondary | ICD-10-CM | POA: Diagnosis not present

## 2014-09-22 DIAGNOSIS — H52223 Regular astigmatism, bilateral: Secondary | ICD-10-CM | POA: Diagnosis not present

## 2014-09-22 DIAGNOSIS — E119 Type 2 diabetes mellitus without complications: Secondary | ICD-10-CM | POA: Diagnosis not present

## 2014-09-22 DIAGNOSIS — H524 Presbyopia: Secondary | ICD-10-CM | POA: Diagnosis not present

## 2014-09-22 DIAGNOSIS — H5213 Myopia, bilateral: Secondary | ICD-10-CM | POA: Diagnosis not present

## 2014-10-27 DIAGNOSIS — E559 Vitamin D deficiency, unspecified: Secondary | ICD-10-CM | POA: Diagnosis not present

## 2014-10-27 DIAGNOSIS — I10 Essential (primary) hypertension: Secondary | ICD-10-CM | POA: Diagnosis not present

## 2014-10-27 DIAGNOSIS — E119 Type 2 diabetes mellitus without complications: Secondary | ICD-10-CM | POA: Diagnosis not present

## 2014-10-29 DIAGNOSIS — M19011 Primary osteoarthritis, right shoulder: Secondary | ICD-10-CM | POA: Diagnosis not present

## 2014-11-03 DIAGNOSIS — E119 Type 2 diabetes mellitus without complications: Secondary | ICD-10-CM | POA: Diagnosis not present

## 2014-11-03 DIAGNOSIS — E780 Pure hypercholesterolemia: Secondary | ICD-10-CM | POA: Diagnosis not present

## 2014-11-03 DIAGNOSIS — I10 Essential (primary) hypertension: Secondary | ICD-10-CM | POA: Diagnosis not present

## 2014-11-03 DIAGNOSIS — E559 Vitamin D deficiency, unspecified: Secondary | ICD-10-CM | POA: Diagnosis not present

## 2014-11-05 DIAGNOSIS — M19011 Primary osteoarthritis, right shoulder: Secondary | ICD-10-CM | POA: Diagnosis not present

## 2014-11-12 DIAGNOSIS — M19011 Primary osteoarthritis, right shoulder: Secondary | ICD-10-CM | POA: Diagnosis not present

## 2014-12-02 NOTE — H&P (Signed)
Amanda Odonnell is an 64 y.o. female.    Chief Complaint: right shoulder pain  HPI: Pt is a 64 y.o. female complaining of right shoulder pain for multiple years. Pain had continually increased since the beginning. X-rays in the clinic show end-stage arthritic changes of the right shoulder. Pt has tried various conservative treatments which have failed to alleviate their symptoms, including injections and therapy. Various options are discussed with the patient. Risks, benefits and expectations were discussed with the patient. Patient understand the risks, benefits and expectations and wishes to proceed with surgery.   PCP:  Pearson Grippe, MD  D/C Plans: Home  PMH: Past Medical History  Diagnosis Date  . Hypertension   . Hyperlipidemia   . Glucose intolerance (impaired glucose tolerance)   . Asthma   . Allergic rhinitis   . Vitamin D deficiency   . Osteoporosis   . Osteoarthritis   . Stress fracture of hip     femeur of right hip  . Anemia   . DM (diabetes mellitus)   . Anxiety   . Glaucoma     PSH: Past Surgical History  Procedure Laterality Date  . Tonsillectomy and adenoidectomy    . Tongue biopsy    . Wisdom tooth extraction    . Elbow surgery Right     plates and pins  . Colonoscopy  08/08/12    Social History:  reports that she has never smoked. She has never used smokeless tobacco. She reports that she does not drink alcohol or use illicit drugs.  Allergies:  Allergies  Allergen Reactions  . Sulfa Antibiotics Hives    Medications: No current facility-administered medications for this encounter.   Current Outpatient Prescriptions  Medication Sig Dispense Refill  . albuterol (PROVENTIL HFA;VENTOLIN HFA) 108 (90 BASE) MCG/ACT inhaler Inhale 2 puffs into the lungs daily.    Marland Kitchen aspirin 81 MG tablet Take 81 mg by mouth daily.    . beta carotene 16109 UNIT capsule Take 25,000 Units by mouth daily.    . Calcium-Magnesium-Vitamin D (CALCIUM 500 PO) Take 1 tablet by  mouth 2 (two) times daily.    . cholecalciferol (VITAMIN D) 1000 UNITS tablet Take 1,000 Units by mouth daily.    . Cinnamon 500 MG capsule Take 500 mg by mouth daily.    . Coenzyme Q10 (CO Q 10) 100 MG CAPS Take 1 capsule by mouth daily.    . diphenhydrAMINE (BENADRYL) 25 mg capsule Take 25 mg by mouth every 6 (six) hours as needed for allergies.    . Fluticasone-Salmeterol (ADVAIR) 100-50 MCG/DOSE AEPB Inhale 1 puff into the lungs 2 (two) times daily.    Marland Kitchen ibuprofen (ADVIL,MOTRIN) 200 MG tablet Take 200 mg by mouth every 6 (six) hours as needed for pain.    . indapamide (LOZOL) 2.5 MG tablet Take 2.5 mg by mouth daily.    . Magnesium 300 MG CAPS Take 1 capsule by mouth daily.    . methocarbamol (ROBAXIN) 500 MG tablet Take 500 mg by mouth 4 (four) times daily.    . Misc Natural Products (TART CHERRY ADVANCED PO) Take by mouth as directed.    . montelukast (SINGULAIR) 10 MG tablet Take 10 mg by mouth at bedtime.    . naproxen sodium (ANAPROX) 220 MG tablet Take 220 mg by mouth daily as needed (pain).    . Omega-3 Fatty Acids (FISH OIL) 500 MG CAPS Take 1 capsule by mouth 2 (two) times daily.    . ranitidine (ZANTAC)  150 MG capsule Take 150 mg by mouth 2 (two) times daily.    . rosuvastatin (CRESTOR) 40 MG tablet Take 20 mg by mouth at bedtime. Take 1/2 tablet once daily.    . sertraline (ZOLOFT) 100 MG tablet Take 100 mg by mouth daily.    . verapamil (VERELAN PM) 360 MG 24 hr capsule Take 360 mg by mouth every morning.      No results found for this or any previous visit (from the past 48 hour(s)). No results found.  ROS: Pain with rom of the right upper extremity  Physical Exam:  Alert and oriented 64 y.o. female in no acute distress Cranial nerves 2-12 intact Cervical spine: full rom with no tenderness, nv intact distally Chest: active breath sounds bilaterally, no wheeze rhonchi or rales Heart: regular rate and rhythm, no murmur Abd: non tender non distended with active bowel  sounds Hip is stable with rom  Right shoulder with moderate crepitus with rom nv intact distally Strength of ER and IR 4/5 No rashes or edema  Assessment/Plan Assessment: right shoulder end stage osteoarthritis  Plan: Patient will undergo a right total shoulder arthroplasty by Dr. Ranell Patrick at [facility] Risks benefits and expectations were discussed with the patient. Patient understand risks, benefits and expectations and wishes to proceed.

## 2014-12-10 ENCOUNTER — Encounter (HOSPITAL_COMMUNITY): Payer: Self-pay

## 2014-12-10 ENCOUNTER — Encounter (HOSPITAL_COMMUNITY)
Admission: RE | Admit: 2014-12-10 | Discharge: 2014-12-10 | Disposition: A | Discharge status: Home / Self Care | Payer: Medicare Other | Source: Ambulatory Visit | Attending: Orthopedic Surgery | Admitting: Orthopedic Surgery

## 2014-12-10 DIAGNOSIS — Z01818 Encounter for other preprocedural examination: Secondary | ICD-10-CM | POA: Insufficient documentation

## 2014-12-10 DIAGNOSIS — E119 Type 2 diabetes mellitus without complications: Secondary | ICD-10-CM | POA: Diagnosis not present

## 2014-12-10 DIAGNOSIS — Z79899 Other long term (current) drug therapy: Secondary | ICD-10-CM | POA: Diagnosis not present

## 2014-12-10 DIAGNOSIS — K219 Gastro-esophageal reflux disease without esophagitis: Secondary | ICD-10-CM | POA: Diagnosis not present

## 2014-12-10 DIAGNOSIS — R9431 Abnormal electrocardiogram [ECG] [EKG]: Secondary | ICD-10-CM | POA: Diagnosis not present

## 2014-12-10 DIAGNOSIS — H409 Unspecified glaucoma: Secondary | ICD-10-CM | POA: Diagnosis not present

## 2014-12-10 DIAGNOSIS — I10 Essential (primary) hypertension: Secondary | ICD-10-CM | POA: Diagnosis not present

## 2014-12-10 DIAGNOSIS — F329 Major depressive disorder, single episode, unspecified: Secondary | ICD-10-CM | POA: Insufficient documentation

## 2014-12-10 DIAGNOSIS — Z23 Encounter for immunization: Secondary | ICD-10-CM | POA: Diagnosis not present

## 2014-12-10 DIAGNOSIS — J45909 Unspecified asthma, uncomplicated: Secondary | ICD-10-CM | POA: Insufficient documentation

## 2014-12-10 DIAGNOSIS — Z01812 Encounter for preprocedural laboratory examination: Secondary | ICD-10-CM | POA: Diagnosis not present

## 2014-12-10 DIAGNOSIS — Z7982 Long term (current) use of aspirin: Secondary | ICD-10-CM | POA: Diagnosis not present

## 2014-12-10 DIAGNOSIS — M19011 Primary osteoarthritis, right shoulder: Secondary | ICD-10-CM | POA: Diagnosis not present

## 2014-12-10 DIAGNOSIS — E785 Hyperlipidemia, unspecified: Secondary | ICD-10-CM | POA: Insufficient documentation

## 2014-12-10 HISTORY — DX: Other muscle spasm: M62.838

## 2014-12-10 HISTORY — DX: Gastro-esophageal reflux disease without esophagitis: K21.9

## 2014-12-10 HISTORY — DX: Depression, unspecified: F32.A

## 2014-12-10 HISTORY — DX: Other chronic pain: G89.29

## 2014-12-10 HISTORY — DX: Obsessive-compulsive disorder, unspecified: F42.9

## 2014-12-10 HISTORY — DX: Dorsalgia, unspecified: M54.9

## 2014-12-10 HISTORY — DX: Unspecified cataract: H26.9

## 2014-12-10 HISTORY — DX: Personal history of other diseases of the respiratory system: Z87.09

## 2014-12-10 HISTORY — DX: Other skin changes: R23.8

## 2014-12-10 HISTORY — DX: Major depressive disorder, single episode, unspecified: F32.9

## 2014-12-10 HISTORY — DX: Pneumonia, unspecified organism: J18.9

## 2014-12-10 HISTORY — DX: Spontaneous ecchymoses: R23.3

## 2014-12-10 HISTORY — DX: Pain in unspecified joint: M25.50

## 2014-12-10 LAB — CBC
HCT: 42.2 % (ref 36.0–46.0)
Hemoglobin: 13.6 g/dL (ref 12.0–15.0)
MCH: 28.3 pg (ref 26.0–34.0)
MCHC: 32.2 g/dL (ref 30.0–36.0)
MCV: 87.9 fL (ref 78.0–100.0)
Platelets: 265 10*3/uL (ref 150–400)
RBC: 4.8 MIL/uL (ref 3.87–5.11)
RDW: 13.7 % (ref 11.5–15.5)
WBC: 5.8 10*3/uL (ref 4.0–10.5)

## 2014-12-10 LAB — BASIC METABOLIC PANEL
Anion gap: 9 (ref 5–15)
BUN: 14 mg/dL (ref 6–20)
CALCIUM: 9.7 mg/dL (ref 8.9–10.3)
CO2: 24 mmol/L (ref 22–32)
CREATININE: 0.7 mg/dL (ref 0.44–1.00)
Chloride: 106 mmol/L (ref 101–111)
Glucose, Bld: 104 mg/dL — ABNORMAL HIGH (ref 65–99)
Potassium: 3.5 mmol/L (ref 3.5–5.1)
SODIUM: 139 mmol/L (ref 135–145)

## 2014-12-10 LAB — SURGICAL PCR SCREEN
MRSA, PCR: NEGATIVE
STAPHYLOCOCCUS AUREUS: NEGATIVE

## 2014-12-10 LAB — GLUCOSE, CAPILLARY: Glucose-Capillary: 143 mg/dL — ABNORMAL HIGH (ref 65–99)

## 2014-12-10 MED ORDER — CHLORHEXIDINE GLUCONATE 4 % EX LIQD: 60.0000 mL | Freq: Once | CUTANEOUS | Status: DC

## 2014-12-10 NOTE — Progress Notes (Addendum)
Cardiologist denies having one  Medical MD is Dr.James Selena Batten  Echo denies ever having one  Stress test denies ever having one  Heart cath denies ever having one  EKG  Denies in pASt yr  CXR denies in past yr

## 2014-12-10 NOTE — Pre-Procedure Instructions (Signed)
Amanda Odonnell  12/10/2014      [facility] [address], [place] - [address] AT [facility] OF [facility] [address] [facility] [address] [place] [address] Phone: [phone] Fax: [phone]    Your procedure is scheduled on Fri, Oct 7 @ 7:30 AM  Report to [facility] Admitting at 5:30 AM  Call this number if you have problems the morning of surgery:  [phone]   Remember:  Do not eat food or drink liquids after midnight.  Take these medicines the morning of surgery with A SIP OF WATER Albuterol<Bring Your Inhaler With You>,Advair(Fluticasone),and Zoloft(Sertraline)              Stop taking your Aspirin,CO Q10,Fish Oil,Ibuprofen,and Aleve. No Goody's,BC's,or any Herbal Medications.                How to Manage Your Diabetes Before Surgery   Why is it important to control my blood sugar before and after surgery?   Improving blood sugar levels before and after surgery helps healing and can limit problems.  A way of improving blood sugar control is eating a healthy diet by:  - Eating less sugar and carbohydrates  - Increasing activity/exercise  - Talk with your doctor about reaching your blood sugar goals  High blood sugars (greater than 180 mg/dL) can raise your risk of infections and slow down your recovery so you will need to focus on controlling your diabetes during the weeks before surgery.  Make sure that the doctor who takes care of your diabetes knows about your planned surgery including the date and location.  How do I manage my blood sugars before surgery?   Check your blood sugar at least 4 times a day, 2 days before surgery to make sure that they are not too high or low.   Check your blood sugar the morning of your surgery when you wake up and every 2               hours until you get to the Short-Stay unit.  If your blood sugar is less than 70 mg/dL, you will need to treat for low blood sugar by:  Treat a low blood  sugar (less than 70 mg/dL) with 1/2 cup of clear juice (cranberry or apple), 4 glucose tablets, OR glucose gel.  Recheck blood sugar in 15 minutes after treatment (to make sure it is greater than 70 mg/dL).  If blood sugar is not greater than 70 mg/dL on re-check, call [phone] for further instructions.   Report your blood sugar to the Short-Stay nurse when you get to Short-Stay.  References:  [facility], 2007 "How to Manage your Diabetes Before and After Surgery".  What do I do about my diabetes medications?   Do not take oral diabetes medicines (pills) the morning of surgery.        Do not take other diabetes injectables the day of surgery including Byetta, Victoza, Bydureon, and Trulicity.    If your CBG is greater than 220 mg/dL, you may take 1/2 of your sliding scale (correction) dose of insulin.   For patients with "Insulin Pumps":  Contact your diabetes doctor for specific instructions before surgery.   Decrease basal insulin rates by 20% at midnight the night before surgery.  Note that if your surgery is planned to be longer than 2 hours, your insulin pump will be removed and intravenous (IV) insulin will be started  and managed by the nurses and anesthesiologist.  You will be able to restart your insulin pump once you are awake and able to manage it.  Make sure to bring insulin pump supplies to the hospital with you in case your site needs to be changed.         Do not wear jewelry, make-up or nail polish.  Do not wear lotions, powders, or perfumes.  You may wear deodorant.  Do not shave 48 hours prior to surgery.    Do not bring valuables to the hospital.  [facility] is not responsible for any belongings or valuables.  Contacts, dentures or bridgework may not be worn into surgery.  Leave your suitcase in the car.  After surgery it may be brought to your room.  For patients admitted to the hospital, discharge time will be  determined by your treatment team.  Patients discharged the day of surgery will not be allowed to drive home.    Special instructions:  [place] - Preparing for Surgery  Before surgery, you can play an important role.  Because skin is not sterile, your skin needs to be as free of germs as possible.  You can reduce the number of germs on you skin by washing with CHG (chlorahexidine gluconate) soap before surgery.  CHG is an antiseptic cleaner which kills germs and bonds with the skin to continue killing germs even after washing.  Please DO NOT use if you have an allergy to CHG or antibacterial soaps.  If your skin becomes reddened/irritated stop using the CHG and inform your nurse when you arrive at Short Stay.  Do not shave (including legs and underarms) for at least 48 hours prior to the first CHG shower.  You may shave your face.  Please follow these instructions carefully:   1.  Shower with CHG Soap the night before surgery and the                                morning of Surgery.  2.  If you choose to wash your hair, wash your hair first as usual with your       normal shampoo.  3.  After you shampoo, rinse your hair and body thoroughly to remove the                      Shampoo.  4.  Use CHG as you would any other liquid soap.  You can apply chg directly       to the skin and wash gently with scrungie or a clean washcloth.  5.  Apply the CHG Soap to your body ONLY FROM THE NECK DOWN.        Do not use on open wounds or open sores.  Avoid contact with your eyes,       ears, mouth and genitals (private parts).  Wash genitals (private parts)       with your normal soap.  6.  Wash thoroughly, paying special attention to the area where your surgery        will be performed.  7.  Thoroughly rinse your body with warm water from the neck down.  8.  DO NOT shower/wash with your normal soap after using and rinsing off       the CHG Soap.  9.  Pat yourself dry with a clean towel.  10.   Wear clean pajamas.            11.  Place clean sheets on your bed the night of your first shower and do not        sleep with pets.  Day of Surgery  Do not apply any lotions/deoderants the morning of surgery.  Please wear clean clothes to the hospital/surgery center.    Please read over the following fact sheets that you were given. Pain Booklet, Coughing and Deep Breathing, MRSA Information and Surgical Site Infection Prevention

## 2014-12-11 LAB — HEMOGLOBIN A1C
HEMOGLOBIN A1C: 5.6 % (ref 4.8–5.6)
MEAN PLASMA GLUCOSE: 114 mg/dL

## 2014-12-11 NOTE — Progress Notes (Signed)
Anesthesia Chart Review: Patient is a 64 year old female scheduled for right total shoulder arthroplasty on 12/19/14 by Dr. Ranell Patrick.  History includes non-smoker, HLD, glaucoma, anemia, DM2, GERD, HTN, asthma, depression, chronic back pain, bruises easily, OCD, T&A.   PCP is Dr. Pearson Grippe who gave medical clearance for this procedure. She denied seeing a cardiologist or having prior cardiac testing other than an EKGs.  Meds include albuterol, ASA 81 mg, Benadryl, 65 Fe, Advair, Lozol, Xalatan, metformin, Robaxin, Singulair, Fish oil, Zantac, Crestor, Zoloft, Simbrinza ophthalmic, verapamil..  12/10/14 EKG: NSR, possible inferior infarct (age undetermined). She had inferior q waves in III and aVF dating back to 05/26/00, but are deeper on most recent tracing. Dr. Elmyra Ricks office sent two comparison EKGs, one from 2010 and one from 2002. The 2002 tracing was similar to the 2002 tracing in [place]. The 2010 tracing had artifact in inferior leads, but I could not definitely see q waves.   Preoperative labs noted. A1C 5.6.   EKGS and history reviewed with anesthesiologists Dr. Aleene Davidson and Dr. Noreene Larsson. She has had inferior Q waves on EKG since 2002. No CV symptoms documented, and she has medical clearance. If no acute changes then it is anticipated that she can proceed as planned.  Velna Ochs [facility] Short Stay Center/Anesthesiology Phone [phone] 12/11/2014 12:21 PM

## 2014-12-18 MED ORDER — CEFAZOLIN SODIUM-DEXTROSE 2-3 GM-% IV SOLR
2.0000 g | INTRAVENOUS | Status: AC
Start: 2014-12-19 — End: 2014-12-19
  Administered 2014-12-19: 2 g via INTRAVENOUS
  Filled 2014-12-18: qty 50

## 2014-12-19 ENCOUNTER — Inpatient Hospital Stay (HOSPITAL_COMMUNITY): Payer: Medicare Other | Admitting: Anesthesiology

## 2014-12-19 ENCOUNTER — Inpatient Hospital Stay (HOSPITAL_COMMUNITY)
Admission: RE | Admit: 2014-12-19 | Discharge: 2014-12-21 | DRG: 483 | Disposition: A | Discharge status: Home / Self Care | Payer: Medicare Other | Source: Ambulatory Visit | Attending: Orthopedic Surgery | Admitting: Orthopedic Surgery

## 2014-12-19 ENCOUNTER — Inpatient Hospital Stay (HOSPITAL_COMMUNITY): Payer: Medicare Other | Admitting: Emergency Medicine

## 2014-12-19 ENCOUNTER — Encounter (HOSPITAL_COMMUNITY)
Admission: RE | Disposition: A | Discharge status: Home / Self Care | Payer: Self-pay | Source: Ambulatory Visit | Attending: Orthopedic Surgery

## 2014-12-19 ENCOUNTER — Encounter (HOSPITAL_COMMUNITY): Payer: Self-pay | Admitting: *Deleted

## 2014-12-19 ENCOUNTER — Inpatient Hospital Stay (HOSPITAL_COMMUNITY): Payer: Medicare Other

## 2014-12-19 DIAGNOSIS — Z9889 Other specified postprocedural states: Secondary | ICD-10-CM | POA: Diagnosis not present

## 2014-12-19 DIAGNOSIS — F419 Anxiety disorder, unspecified: Secondary | ICD-10-CM | POA: Diagnosis present

## 2014-12-19 DIAGNOSIS — Z882 Allergy status to sulfonamides status: Secondary | ICD-10-CM

## 2014-12-19 DIAGNOSIS — Z7982 Long term (current) use of aspirin: Secondary | ICD-10-CM

## 2014-12-19 DIAGNOSIS — M19011 Primary osteoarthritis, right shoulder: Secondary | ICD-10-CM | POA: Diagnosis present

## 2014-12-19 DIAGNOSIS — I10 Essential (primary) hypertension: Secondary | ICD-10-CM | POA: Diagnosis present

## 2014-12-19 DIAGNOSIS — E119 Type 2 diabetes mellitus without complications: Secondary | ICD-10-CM | POA: Diagnosis present

## 2014-12-19 DIAGNOSIS — Z79899 Other long term (current) drug therapy: Secondary | ICD-10-CM

## 2014-12-19 DIAGNOSIS — J45909 Unspecified asthma, uncomplicated: Secondary | ICD-10-CM | POA: Diagnosis present

## 2014-12-19 DIAGNOSIS — G8918 Other acute postprocedural pain: Secondary | ICD-10-CM | POA: Diagnosis not present

## 2014-12-19 DIAGNOSIS — E876 Hypokalemia: Secondary | ICD-10-CM | POA: Diagnosis present

## 2014-12-19 DIAGNOSIS — H409 Unspecified glaucoma: Secondary | ICD-10-CM | POA: Diagnosis present

## 2014-12-19 DIAGNOSIS — M810 Age-related osteoporosis without current pathological fracture: Secondary | ICD-10-CM | POA: Diagnosis present

## 2014-12-19 DIAGNOSIS — Z96611 Presence of right artificial shoulder joint: Secondary | ICD-10-CM

## 2014-12-19 DIAGNOSIS — M25511 Pain in right shoulder: Secondary | ICD-10-CM | POA: Diagnosis not present

## 2014-12-19 DIAGNOSIS — E785 Hyperlipidemia, unspecified: Secondary | ICD-10-CM | POA: Diagnosis present

## 2014-12-19 DIAGNOSIS — Z96619 Presence of unspecified artificial shoulder joint: Secondary | ICD-10-CM

## 2014-12-19 DIAGNOSIS — E559 Vitamin D deficiency, unspecified: Secondary | ICD-10-CM | POA: Diagnosis present

## 2014-12-19 HISTORY — PX: TOTAL SHOULDER ARTHROPLASTY: SHX126

## 2014-12-19 LAB — GLUCOSE, CAPILLARY
GLUCOSE-CAPILLARY: 106 mg/dL — AB (ref 65–99)
GLUCOSE-CAPILLARY: 128 mg/dL — AB (ref 65–99)
Glucose-Capillary: 120 mg/dL — ABNORMAL HIGH (ref 65–99)

## 2014-12-19 SURGERY — ARTHROPLASTY, SHOULDER, TOTAL
Anesthesia: General | Site: Shoulder | Laterality: Right

## 2014-12-19 MED ORDER — VITAMIN C 500 MG PO TABS
500.0000 mg | ORAL_TABLET | Freq: Every day | ORAL | Status: DC
  Administered 2014-12-20 – 2014-12-21 (×2): 500 mg via ORAL
  Filled 2014-12-19 (×2): qty 1

## 2014-12-19 MED ORDER — CALCIUM CARBONATE ANTACID 500 MG PO CHEW: 1.0000 | CHEWABLE_TABLET | Freq: Every day | ORAL | Status: DC | PRN

## 2014-12-19 MED ORDER — ROSUVASTATIN CALCIUM 10 MG PO TABS
20.0000 mg | ORAL_TABLET | Freq: Every day | ORAL | Status: DC
  Administered 2014-12-19 – 2014-12-20 (×2): 20 mg via ORAL
  Filled 2014-12-19 (×2): qty 2

## 2014-12-19 MED ORDER — ACETAMINOPHEN 650 MG RE SUPP: 650.0000 mg | Freq: Four times a day (QID) | RECTAL | Status: DC | PRN

## 2014-12-19 MED ORDER — METFORMIN HCL 500 MG PO TABS
250.0000 mg | ORAL_TABLET | Freq: Every day | ORAL | Status: DC
  Administered 2014-12-19: 500 mg via ORAL
  Administered 2014-12-20: 250 mg via ORAL
  Filled 2014-12-19 (×2): qty 1

## 2014-12-19 MED ORDER — METHOCARBAMOL 500 MG PO TABS
500.0000 mg | ORAL_TABLET | Freq: Four times a day (QID) | ORAL | Status: DC | PRN
  Administered 2014-12-20 – 2014-12-21 (×4): 500 mg via ORAL
  Filled 2014-12-19 (×4): qty 1

## 2014-12-19 MED ORDER — DIPHENHYDRAMINE HCL 25 MG PO CAPS: 25.0000 mg | ORAL_CAPSULE | Freq: Four times a day (QID) | ORAL | Status: DC | PRN

## 2014-12-19 MED ORDER — DOCUSATE SODIUM 100 MG PO CAPS
100.0000 mg | ORAL_CAPSULE | Freq: Two times a day (BID) | ORAL | Status: DC
  Administered 2014-12-19 – 2014-12-21 (×4): 100 mg via ORAL
  Filled 2014-12-19 (×4): qty 1

## 2014-12-19 MED ORDER — POLYETHYLENE GLYCOL 3350 17 G PO PACK: 17.0000 g | PACK | Freq: Every day | ORAL | Status: DC | PRN

## 2014-12-19 MED ORDER — SUCCINYLCHOLINE CHLORIDE 20 MG/ML IJ SOLN
INTRAMUSCULAR | Status: DC | PRN
  Administered 2014-12-19: 100 mg via INTRAVENOUS

## 2014-12-19 MED ORDER — SODIUM CHLORIDE 0.9 % IV SOLN
INTRAVENOUS | Status: DC
  Administered 2014-12-19 – 2014-12-20 (×2): via INTRAVENOUS

## 2014-12-19 MED ORDER — SUCCINYLCHOLINE CHLORIDE 20 MG/ML IJ SOLN
INTRAMUSCULAR | Status: AC
  Filled 2014-12-19: qty 1

## 2014-12-19 MED ORDER — THROMBIN 5000 UNITS EX SOLR
CUTANEOUS | Status: AC
  Filled 2014-12-19: qty 5000

## 2014-12-19 MED ORDER — OMEGA-3-ACID ETHYL ESTERS 1 G PO CAPS
2.0000 g | ORAL_CAPSULE | Freq: Every day | ORAL | Status: DC
  Administered 2014-12-20 – 2014-12-21 (×2): 2 g via ORAL
  Filled 2014-12-19 (×2): qty 2

## 2014-12-19 MED ORDER — PROPOFOL 10 MG/ML IV BOLUS
INTRAVENOUS | Status: AC
  Filled 2014-12-19: qty 20

## 2014-12-19 MED ORDER — HYDROCODONE-ACETAMINOPHEN 5-325 MG PO TABS
1.0000 | ORAL_TABLET | ORAL | Status: DC | PRN
  Administered 2014-12-19 – 2014-12-21 (×6): 2 via ORAL
  Filled 2014-12-19 (×6): qty 2

## 2014-12-19 MED ORDER — ONDANSETRON HCL 4 MG/2ML IJ SOLN
INTRAMUSCULAR | Status: DC | PRN
  Administered 2014-12-19: 4 mg via INTRAVENOUS

## 2014-12-19 MED ORDER — CO Q 10 100 MG PO CAPS: 1.0000 | ORAL_CAPSULE | Freq: Every day | ORAL | Status: DC

## 2014-12-19 MED ORDER — HYDROCODONE-ACETAMINOPHEN 5-325 MG PO TABS: 1.0000 | ORAL_TABLET | Freq: Four times a day (QID) | ORAL | Status: DC | PRN

## 2014-12-19 MED ORDER — ASPIRIN 81 MG PO CHEW
81.0000 mg | CHEWABLE_TABLET | Freq: Every day | ORAL | Status: DC
  Administered 2014-12-20 – 2014-12-21 (×2): 81 mg via ORAL
  Filled 2014-12-19 (×4): qty 1

## 2014-12-19 MED ORDER — ALBUTEROL SULFATE (2.5 MG/3ML) 0.083% IN NEBU
2.0000 mL | INHALATION_SOLUTION | Freq: Every day | RESPIRATORY_TRACT | Status: DC
  Administered 2014-12-20: 2 mL via RESPIRATORY_TRACT
  Administered 2014-12-21: 2.5 mL via RESPIRATORY_TRACT
  Filled 2014-12-19 (×2): qty 3

## 2014-12-19 MED ORDER — ONDANSETRON HCL 4 MG/2ML IJ SOLN: 4.0000 mg | Freq: Four times a day (QID) | INTRAMUSCULAR | Status: DC | PRN

## 2014-12-19 MED ORDER — LATANOPROST 0.005 % OP SOLN
1.0000 [drp] | Freq: Every day | OPHTHALMIC | Status: DC
  Administered 2014-12-19 – 2014-12-20 (×2): 1 [drp] via OPHTHALMIC
  Filled 2014-12-19 (×2): qty 2.5

## 2014-12-19 MED ORDER — VERAPAMIL HCL ER 120 MG PO TBCR
360.0000 mg | EXTENDED_RELEASE_TABLET | Freq: Every morning | ORAL | Status: DC
Start: 2014-12-19 — End: 2014-12-21
  Administered 2014-12-19 – 2014-12-21 (×3): 360 mg via ORAL
  Filled 2014-12-19 (×3): qty 3

## 2014-12-19 MED ORDER — ONDANSETRON HCL 4 MG PO TABS: 4.0000 mg | ORAL_TABLET | Freq: Four times a day (QID) | ORAL | Status: DC | PRN

## 2014-12-19 MED ORDER — PHENOL 1.4 % MT LIQD: 1.0000 | OROMUCOSAL | Status: DC | PRN

## 2014-12-19 MED ORDER — INSULIN ASPART 100 UNIT/ML ~~LOC~~ SOLN
0.0000 [IU] | Freq: Three times a day (TID) | SUBCUTANEOUS | Status: DC
Start: 2014-12-19 — End: 2014-12-21
  Administered 2014-12-20: 3 [IU] via SUBCUTANEOUS
  Administered 2014-12-20 – 2014-12-21 (×3): 2 [IU] via SUBCUTANEOUS

## 2014-12-19 MED ORDER — ADULT MULTIVITAMIN W/MINERALS CH
1.0000 | ORAL_TABLET | Freq: Every day | ORAL | Status: DC
  Administered 2014-12-20 – 2014-12-21 (×2): 1 via ORAL
  Filled 2014-12-19 (×4): qty 1

## 2014-12-19 MED ORDER — CALCIUM CARBONATE-VITAMIN D 500-200 MG-UNIT PO TABS
1.0000 | ORAL_TABLET | Freq: Two times a day (BID) | ORAL | Status: DC
  Administered 2014-12-19 – 2014-12-21 (×4): 1 via ORAL
  Filled 2014-12-19 (×4): qty 1

## 2014-12-19 MED ORDER — MONTELUKAST SODIUM 10 MG PO TABS
10.0000 mg | ORAL_TABLET | Freq: Every day | ORAL | Status: DC
  Administered 2014-12-19 – 2014-12-20 (×2): 10 mg via ORAL
  Filled 2014-12-19 (×2): qty 1

## 2014-12-19 MED ORDER — LACTATED RINGERS IV SOLN
INTRAVENOUS | Status: DC | PRN
  Administered 2014-12-19 (×2): via INTRAVENOUS

## 2014-12-19 MED ORDER — FENTANYL CITRATE (PF) 100 MCG/2ML IJ SOLN
INTRAMUSCULAR | Status: DC | PRN
  Administered 2014-12-19: 50 ug via INTRAVENOUS
  Administered 2014-12-19: 100 ug via INTRAVENOUS
  Administered 2014-12-19 (×2): 50 ug via INTRAVENOUS

## 2014-12-19 MED ORDER — PROPOFOL 10 MG/ML IV BOLUS
INTRAVENOUS | Status: DC | PRN
Start: 2014-12-19 — End: 2014-12-19
  Administered 2014-12-19: 100 mg via INTRAVENOUS

## 2014-12-19 MED ORDER — LIDOCAINE HCL (CARDIAC) 20 MG/ML IV SOLN
INTRAVENOUS | Status: DC | PRN
  Administered 2014-12-19: 50 mg via INTRAVENOUS

## 2014-12-19 MED ORDER — THROMBIN 5000 UNITS EX SOLR
OROMUCOSAL | Status: DC | PRN
  Administered 2014-12-19: 5 mL via TOPICAL

## 2014-12-19 MED ORDER — THROMBIN 5000 UNITS EX SOLR: CUTANEOUS | Status: DC | PRN

## 2014-12-19 MED ORDER — FENTANYL CITRATE (PF) 100 MCG/2ML IJ SOLN: 25.0000 ug | INTRAMUSCULAR | Status: DC | PRN

## 2014-12-19 MED ORDER — INDAPAMIDE 2.5 MG PO TABS
2.5000 mg | ORAL_TABLET | Freq: Every day | ORAL | Status: DC
  Administered 2014-12-20 – 2014-12-21 (×2): 2.5 mg via ORAL
  Filled 2014-12-19 (×3): qty 1

## 2014-12-19 MED ORDER — ACETAMINOPHEN 325 MG PO TABS: 650.0000 mg | ORAL_TABLET | Freq: Four times a day (QID) | ORAL | Status: DC | PRN

## 2014-12-19 MED ORDER — BUPIVACAINE-EPINEPHRINE (PF) 0.25% -1:200000 IJ SOLN
INTRAMUSCULAR | Status: AC
  Filled 2014-12-19: qty 30

## 2014-12-19 MED ORDER — BETA CAROTENE 25000 UNITS PO CAPS: 25000.0000 [IU] | ORAL_CAPSULE | Freq: Every day | ORAL | Status: DC

## 2014-12-19 MED ORDER — FERROUS SULFATE 325 (65 FE) MG PO TABS
325.0000 mg | ORAL_TABLET | Freq: Every day | ORAL | Status: DC
  Administered 2014-12-20 – 2014-12-21 (×2): 325 mg via ORAL
  Filled 2014-12-19 (×2): qty 1

## 2014-12-19 MED ORDER — CINNAMON 500 MG PO CAPS: 500.0000 mg | ORAL_CAPSULE | Freq: Two times a day (BID) | ORAL | Status: DC

## 2014-12-19 MED ORDER — CEFAZOLIN SODIUM-DEXTROSE 2-3 GM-% IV SOLR
2.0000 g | Freq: Four times a day (QID) | INTRAVENOUS | Status: AC
  Administered 2014-12-19 (×2): 2 g via INTRAVENOUS
  Filled 2014-12-19 (×3): qty 50

## 2014-12-19 MED ORDER — MAGNESIUM 250 MG PO TABS: 1.0000 | ORAL_TABLET | Freq: Every day | ORAL | Status: DC

## 2014-12-19 MED ORDER — SERTRALINE HCL 100 MG PO TABS
100.0000 mg | ORAL_TABLET | Freq: Every day | ORAL | Status: DC
  Administered 2014-12-20 – 2014-12-21 (×2): 100 mg via ORAL
  Filled 2014-12-19 (×2): qty 1

## 2014-12-19 MED ORDER — METOCLOPRAMIDE HCL 5 MG PO TABS: 5.0000 mg | ORAL_TABLET | Freq: Three times a day (TID) | ORAL | Status: DC | PRN

## 2014-12-19 MED ORDER — BISACODYL 10 MG RE SUPP: 10.0000 mg | Freq: Every day | RECTAL | Status: DC | PRN

## 2014-12-19 MED ORDER — PHENYLEPHRINE HCL 10 MG/ML IJ SOLN
10.0000 mg | INTRAVENOUS | Status: DC | PRN
  Administered 2014-12-19: 50 ug/min via INTRAVENOUS

## 2014-12-19 MED ORDER — ROCURONIUM BROMIDE 50 MG/5ML IV SOLN
INTRAVENOUS | Status: AC
  Filled 2014-12-19: qty 1

## 2014-12-19 MED ORDER — INSULIN ASPART 100 UNIT/ML ~~LOC~~ SOLN
4.0000 [IU] | Freq: Three times a day (TID) | SUBCUTANEOUS | Status: DC
  Administered 2014-12-20 – 2014-12-21 (×4): 4 [IU] via SUBCUTANEOUS

## 2014-12-19 MED ORDER — MOMETASONE FURO-FORMOTEROL FUM 100-5 MCG/ACT IN AERO
2.0000 | INHALATION_SPRAY | Freq: Two times a day (BID) | RESPIRATORY_TRACT | Status: DC
  Administered 2014-12-20 – 2014-12-21 (×3): 2 via RESPIRATORY_TRACT
  Filled 2014-12-19: qty 8.8

## 2014-12-19 MED ORDER — FENTANYL CITRATE (PF) 250 MCG/5ML IJ SOLN
INTRAMUSCULAR | Status: AC
  Filled 2014-12-19: qty 5

## 2014-12-19 MED ORDER — METHOCARBAMOL 500 MG PO TABS: 500.0000 mg | ORAL_TABLET | Freq: Three times a day (TID) | ORAL | Status: DC | PRN

## 2014-12-19 MED ORDER — LIDOCAINE HCL (CARDIAC) 20 MG/ML IV SOLN
INTRAVENOUS | Status: AC
  Filled 2014-12-19: qty 5

## 2014-12-19 MED ORDER — MORPHINE SULFATE (PF) 2 MG/ML IV SOLN
INTRAVENOUS | Status: AC
  Administered 2014-12-19: 2 mg via INTRAVENOUS
  Filled 2014-12-19: qty 1

## 2014-12-19 MED ORDER — MIDAZOLAM HCL 5 MG/5ML IJ SOLN
INTRAMUSCULAR | Status: DC | PRN
  Administered 2014-12-19: 2 mg via INTRAVENOUS

## 2014-12-19 MED ORDER — CALCIUM-MAGNESIUM-VITAMIN D 500-250-200 MG-MG-UNIT PO TABS: ORAL_TABLET | Freq: Two times a day (BID) | ORAL | Status: DC

## 2014-12-19 MED ORDER — BUPIVACAINE-EPINEPHRINE (PF) 0.5% -1:200000 IJ SOLN
INTRAMUSCULAR | Status: DC | PRN
  Administered 2014-12-19: 25 mL via PERINEURAL

## 2014-12-19 MED ORDER — METOCLOPRAMIDE HCL 5 MG/ML IJ SOLN: 5.0000 mg | Freq: Three times a day (TID) | INTRAMUSCULAR | Status: DC | PRN

## 2014-12-19 MED ORDER — MORPHINE SULFATE (PF) 2 MG/ML IV SOLN
2.0000 mg | INTRAVENOUS | Status: DC | PRN
  Administered 2014-12-20 (×2): 2 mg via INTRAVENOUS
  Filled 2014-12-19 (×2): qty 1

## 2014-12-19 MED ORDER — MIDAZOLAM HCL 2 MG/2ML IJ SOLN
INTRAMUSCULAR | Status: AC
  Filled 2014-12-19: qty 4

## 2014-12-19 MED ORDER — SODIUM CHLORIDE 0.9 % IR SOLN
Status: DC | PRN
  Administered 2014-12-19: 1000 mL

## 2014-12-19 MED ORDER — BUPIVACAINE-EPINEPHRINE 0.25% -1:200000 IJ SOLN
INTRAMUSCULAR | Status: DC | PRN
  Administered 2014-12-19: 8 mL

## 2014-12-19 MED ORDER — INSULIN ASPART 100 UNIT/ML ~~LOC~~ SOLN: 0.0000 [IU] | Freq: Every day | SUBCUTANEOUS | Status: DC

## 2014-12-19 MED ORDER — FAMOTIDINE 20 MG PO TABS
20.0000 mg | ORAL_TABLET | Freq: Two times a day (BID) | ORAL | Status: DC
  Administered 2014-12-19 – 2014-12-21 (×4): 20 mg via ORAL
  Filled 2014-12-19 (×4): qty 1

## 2014-12-19 MED ORDER — VITAMIN D 1000 UNITS PO TABS
1000.0000 [IU] | ORAL_TABLET | Freq: Every day | ORAL | Status: DC
  Administered 2014-12-20 – 2014-12-21 (×2): 1000 [IU] via ORAL
  Filled 2014-12-19 (×2): qty 1

## 2014-12-19 MED ORDER — MENTHOL 3 MG MT LOZG: 1.0000 | LOZENGE | OROMUCOSAL | Status: DC | PRN

## 2014-12-19 SURGICAL SUPPLY — 73 items
BLADE SAW SAG 73X25 THK (BLADE) ×2
BLADE SAW SGTL 73X25 THK (BLADE) ×1 IMPLANT
BOWL SMART MIX CTS (DISPOSABLE) ×3 IMPLANT
BUR SURG 4X8 MED (BURR) IMPLANT
BURR SURG 4MMX8MM MEDIUM (BURR)
BURR SURG 4X8 MED (BURR)
CAPT SHLDR TOTAL 2 ×3 IMPLANT
CEMENT BONE DEPUY (Cement) ×3 IMPLANT
CEMENT HV SMART SET (Cement) ×3 IMPLANT
CLOSURE WOUND 1/2 X4 (GAUZE/BANDAGES/DRESSINGS) ×1
COVER SURGICAL LIGHT HANDLE (MISCELLANEOUS) ×3 IMPLANT
DRAPE IMP U-DRAPE 54X76 (DRAPES) ×3 IMPLANT
DRAPE INCISE IOBAN 66X45 STRL (DRAPES) ×6 IMPLANT
DRAPE U-SHAPE 47X51 STRL (DRAPES) ×3 IMPLANT
DRAPE X-RAY CASS 24X20 (DRAPES) IMPLANT
DRILL BIT 5/64 (BIT) ×3 IMPLANT
DRSG ADAPTIC 3X8 NADH LF (GAUZE/BANDAGES/DRESSINGS) ×3 IMPLANT
DRSG PAD ABDOMINAL 8X10 ST (GAUZE/BANDAGES/DRESSINGS) ×3 IMPLANT
DURAPREP 26ML APPLICATOR (WOUND CARE) ×3 IMPLANT
ELECT BLADE 4.0 EZ CLEAN MEGAD (MISCELLANEOUS) ×3
ELECT NEEDLE TIP 2.8 STRL (NEEDLE) ×3 IMPLANT
ELECT REM PT RETURN 9FT ADLT (ELECTROSURGICAL) ×3
ELECTRODE BLDE 4.0 EZ CLN MEGD (MISCELLANEOUS) ×1 IMPLANT
ELECTRODE REM PT RTRN 9FT ADLT (ELECTROSURGICAL) ×1 IMPLANT
GAUZE SPONGE 4X4 12PLY STRL (GAUZE/BANDAGES/DRESSINGS) ×3 IMPLANT
GLOVE BIOGEL PI ORTHO PRO 7.5 (GLOVE) ×2
GLOVE BIOGEL PI ORTHO PRO SZ8 (GLOVE) ×2
GLOVE ORTHO TXT STRL SZ7.5 (GLOVE) ×3 IMPLANT
GLOVE PI ORTHO PRO STRL 7.5 (GLOVE) ×1 IMPLANT
GLOVE PI ORTHO PRO STRL SZ8 (GLOVE) ×1 IMPLANT
GLOVE SURG ORTHO 8.5 STRL (GLOVE) ×6 IMPLANT
GOWN STRL REUS W/ TWL XL LVL3 (GOWN DISPOSABLE) ×3 IMPLANT
GOWN STRL REUS W/TWL XL LVL3 (GOWN DISPOSABLE) ×9
HANDPIECE INTERPULSE COAX TIP (DISPOSABLE)
KIT BASIN OR (CUSTOM PROCEDURE TRAY) ×3 IMPLANT
KIT ROOM TURNOVER OR (KITS) ×3 IMPLANT
MANIFOLD NEPTUNE II (INSTRUMENTS) ×3 IMPLANT
NDL SUT 6 .5 CRC .975X.05 MAYO (NEEDLE) ×2 IMPLANT
NEEDLE 1/2 CIR MAYO (NEEDLE) ×3 IMPLANT
NEEDLE HYPO 25GX1X1/2 BEV (NEEDLE) ×3 IMPLANT
NEEDLE MAYO TAPER (NEEDLE) ×6
NS IRRIG 1000ML POUR BTL (IV SOLUTION) ×3 IMPLANT
PACK SHOULDER (CUSTOM PROCEDURE TRAY) ×3 IMPLANT
PACK UNIVERSAL I (CUSTOM PROCEDURE TRAY) ×3 IMPLANT
PAD ARMBOARD 7.5X6 YLW CONV (MISCELLANEOUS) ×6 IMPLANT
PIN METAGLENE 2.5 (PIN) ×3 IMPLANT
SET HNDPC FAN SPRY TIP SCT (DISPOSABLE) IMPLANT
SLING ARM IMMOBILIZER LRG (SOFTGOODS) IMPLANT
SLING ARM IMMOBILIZER MED (SOFTGOODS) IMPLANT
SMARTMIX MINI TOWER (MISCELLANEOUS)
SPONGE LAP 18X18 X RAY DECT (DISPOSABLE) ×3 IMPLANT
SPONGE LAP 4X18 X RAY DECT (DISPOSABLE) ×3 IMPLANT
SPONGE SURGIFOAM ABS GEL SZ50 (HEMOSTASIS) IMPLANT
STRIP CLOSURE SKIN 1/2X4 (GAUZE/BANDAGES/DRESSINGS) ×2 IMPLANT
SUCTION FRAZIER TIP 10 FR DISP (SUCTIONS) ×3 IMPLANT
SUT FIBERWIRE #2 38 T-5 BLUE (SUTURE) ×24
SUT MNCRL AB 4-0 PS2 18 (SUTURE) ×3 IMPLANT
SUT VIC AB 0 CT1 27 (SUTURE) ×2
SUT VIC AB 0 CT1 27XBRD ANBCTR (SUTURE) ×1 IMPLANT
SUT VIC AB 0 CT2 27 (SUTURE) ×3 IMPLANT
SUT VIC AB 2-0 CT1 27 (SUTURE) ×3
SUT VIC AB 2-0 CT1 TAPERPNT 27 (SUTURE) ×1 IMPLANT
SUT VICRYL AB 2 0 TIES (SUTURE) ×3 IMPLANT
SUTURE FIBERWR #2 38 T-5 BLUE (SUTURE) ×8 IMPLANT
SYR CONTROL 10ML LL (SYRINGE) ×3 IMPLANT
TAPE CLOTH SURG 6X10 WHT LF (GAUZE/BANDAGES/DRESSINGS) ×3 IMPLANT
TOWEL OR 17X24 6PK STRL BLUE (TOWEL DISPOSABLE) ×3 IMPLANT
TOWEL OR 17X26 10 PK STRL BLUE (TOWEL DISPOSABLE) ×3 IMPLANT
TOWER SMARTMIX MINI (MISCELLANEOUS) IMPLANT
TRAY CATH 16FR W/PLASTIC CATH (SET/KITS/TRAYS/PACK) ×3 IMPLANT
TRAY FOLEY CATH 16FRSI W/METER (SET/KITS/TRAYS/PACK) IMPLANT
WATER STERILE IRR 1000ML POUR (IV SOLUTION) ×3 IMPLANT
YANKAUER SUCT BULB TIP NO VENT (SUCTIONS) ×3 IMPLANT

## 2014-12-19 NOTE — Transfer of Care (Signed)
Immediate Anesthesia Transfer of Care Note  Patient: Amanda Odonnell  Procedure(s) Performed: Procedure(s): TOTAL SHOULDER ARTHROPLASTY (Right)  Patient Location: PACU  Anesthesia Type:General  Level of Consciousness: awake  Airway & Oxygen Therapy: Patient Spontanous Breathing  Post-op Assessment: Report given to RN and Post -op Vital signs reviewed and stable  Post vital signs: Reviewed and stable  Last Vitals:  Filed Vitals:   12/19/14 1041  BP:   Pulse:   Temp: 37.1 C  Resp:     Complications: No apparent anesthesia complications

## 2014-12-19 NOTE — Anesthesia Postprocedure Evaluation (Signed)
  Anesthesia Post-op Note  Patient: Amanda Odonnell  Procedure(s) Performed: Procedure(s): TOTAL SHOULDER ARTHROPLASTY (Right)  Patient Location: PACU  Anesthesia Type:GA combined with regional for post-op pain  Level of Consciousness: awake, alert , oriented and patient cooperative  Airway and Oxygen Therapy: Patient Spontanous Breathing  Post-op Pain: none  Post-op Assessment: Post-op Vital signs reviewed, Patient's Cardiovascular Status Stable, Respiratory Function Stable, Patent Airway, No signs of Nausea or vomiting and Pain level controlled              Post-op Vital Signs: Reviewed and stable  Last Vitals:  Filed Vitals:   12/19/14 1145  BP: 120/68  Pulse: 74  Temp:   Resp: 16    Complications: No apparent anesthesia complications

## 2014-12-19 NOTE — Progress Notes (Signed)
Pt refuse MDI

## 2014-12-19 NOTE — Brief Op Note (Signed)
12/19/2014  10:49 AM  PATIENT:  Edward Jolly  64 y.o. female  PRE-OPERATIVE DIAGNOSIS:  RIGHT SHOULDER OA, END STAGED  POST-OPERATIVE DIAGNOSIS:  RIGHT SHOULDER OA, END STAGED  PROCEDURE:  Procedure(s): TOTAL SHOULDER ARTHROPLASTY (Right) [facility]  SURGEON:  Surgeon(s) and Role:    * Beverely Low, MD - Primary  PHYSICIAN ASSISTANT:   ASSISTANTS: Thea Gist, PA-C   ANESTHESIA:   regional and general  EBL:  Total I/O In: 1800 [I.V.:1800] Out: 500 [Urine:400; Blood:100]  BLOOD ADMINISTERED:none  DRAINS: none   LOCAL MEDICATIONS USED:  MARCAINE     SPECIMEN:  No Specimen  DISPOSITION OF SPECIMEN:  N/A  COUNTS:  YES  TOURNIQUET:  * No tourniquets in log *  DICTATION: .Other Dictation: Dictation Number [id number]  PLAN OF CARE: Admit to inpatient   PATIENT DISPOSITION:  PACU - hemodynamically stable.   Delay start of Pharmacological VTE agent (>24hrs) due to surgical blood loss or risk of bleeding: not applicable

## 2014-12-19 NOTE — Anesthesia Procedure Notes (Addendum)
Anesthesia Regional Block:  Interscalene brachial plexus block  Pre-Anesthetic Checklist: ,, timeout performed, Correct Patient, Correct Site, Correct Laterality, Correct Procedure, Correct Position, site marked, Risks and benefits discussed, Surgical consent,  Pre-op evaluation,  At surgeon's request  Laterality: Upper and Right  Prep: Betadine and chloraprep       Needles:  Injection technique: Single-shot  Needle Type: Echogenic Stimulator Needle     Needle Length: 4cm 4 cm Needle Gauge: 22 and 22 G  Needle insertion depth: 4 cm   Additional Needles:  Procedures: ultrasound guided (picture in chart) and nerve stimulator Interscalene brachial plexus block  Nerve Stimulator or Paresthesia:  Response: Twitch elicited, 0.5 mA, 0.3 ms,   Additional Responses:   Narrative:  Start time: 12/19/2014 7:00 AM End time: 12/19/2014 7:15 AM Injection made incrementally with aspirations every 5 mL.  Performed by: Personally  Anesthesiologist: MASSAGEE, TERRY  Additional Notes: Block assessed prior to start of surgery   Procedure Name: Intubation Date/Time: 12/19/2014 7:39 AM Performed by: Gavin Pound, Onisha Cedeno J Pre-anesthesia Checklist: Patient identified, Timeout performed, Emergency Drugs available, Suction available and Patient being monitored Patient Re-evaluated:Patient Re-evaluated prior to inductionOxygen Delivery Method: Circle system utilized Preoxygenation: Pre-oxygenation with 100% oxygen Intubation Type: IV induction Ventilation: Mask ventilation without difficulty Laryngoscope Size: Mac and 3 Grade View: Grade II Tube type: Oral Tube size: 7.0 mm Number of attempts: 1 Placement Confirmation: ETT inserted through vocal cords under direct vision,  breath sounds checked- equal and bilateral and positive ETCO2 Secured at: 21 cm Tube secured with: Tape Dental Injury: Teeth and Oropharynx as per pre-operative assessment

## 2014-12-19 NOTE — Anesthesia Preprocedure Evaluation (Addendum)
Anesthesia Evaluation  Patient identified by MRN, date of birth, ID band Patient awake    Reviewed: Allergy & Precautions, NPO status , Patient's Chart, lab work & pertinent test results  Airway Mallampati: II       Dental   Pulmonary asthma ,    breath sounds clear to auscultation       Cardiovascular hypertension,  Rhythm:Regular Rate:Normal     Neuro/Psych negative neurological ROS     GI/Hepatic   Endo/Other  diabetes  Renal/GU      Musculoskeletal  (+) Arthritis ,   Abdominal   Peds  Hematology   Anesthesia Other Findings   Reproductive/Obstetrics                            Anesthesia Physical Anesthesia Plan  ASA: III  Anesthesia Plan: General   Post-op Pain Management: GA combined w/ Regional for post-op pain   Induction:   Airway Management Planned: Oral ETT  Additional Equipment:   Intra-op Plan:   Post-operative Plan: Extubation in OR  Informed Consent: I have reviewed the patients History and Physical, chart, labs and discussed the procedure including the risks, benefits and alternatives for the proposed anesthesia with the patient or authorized representative who has indicated his/her understanding and acceptance.   Dental advisory given  Plan Discussed with: CRNA and Surgeon  Anesthesia Plan Comments:         Anesthesia Quick Evaluation

## 2014-12-19 NOTE — Progress Notes (Signed)
Lunch relief by MA Abhiram Criado RN 

## 2014-12-19 NOTE — Progress Notes (Signed)
Utilization review completed.  

## 2014-12-19 NOTE — Interval H&P Note (Signed)
History and Physical Interval Note:  12/19/2014 7:26 AM  Amanda Odonnell  has presented today for surgery, with the diagnosis of RIGHT SHOULDER OA  The various methods of treatment have been discussed with the patient and family. After consideration of risks, benefits and other options for treatment, the patient has consented to  Procedure(s): TOTAL SHOULDER ARTHROPLASTY (Right) as a surgical intervention .  The patient's history has been reviewed, patient examined, no change in status, stable for surgery.  I have reviewed the patient's chart and labs.  Questions were answered to the patient's satisfaction.     Allure Greaser,STEVEN R

## 2014-12-19 NOTE — Discharge Instructions (Signed)
Ice to the right shoulder constantly.  Use sling while up walking around.  Ok to remove and hug a pillow while seated.  Use a rolled blanket or pillow to prop behind the elbow to keep the arm across the waist while seated or sleeping.  It is better to sleep semi-upright in a recliner or propped up in bed for the first few weeks.  No heavy push pull or lift with the right arm - like no pushing out of a chair.  Keep the incision covered until Monday when you can change the bandages to gel bandage.  Ok to shower and get the wound wet in one week.  Follow up with Dr Ranell Patrick in two weeks in the office  [phone]

## 2014-12-20 LAB — HEMOGLOBIN AND HEMATOCRIT, BLOOD
HEMATOCRIT: 34.2 % — AB (ref 36.0–46.0)
Hemoglobin: 11.1 g/dL — ABNORMAL LOW (ref 12.0–15.0)

## 2014-12-20 LAB — BASIC METABOLIC PANEL
Anion gap: 9 (ref 5–15)
BUN: 10 mg/dL (ref 6–20)
CHLORIDE: 101 mmol/L (ref 101–111)
CO2: 28 mmol/L (ref 22–32)
CREATININE: 0.69 mg/dL (ref 0.44–1.00)
Calcium: 8.9 mg/dL (ref 8.9–10.3)
Glucose, Bld: 150 mg/dL — ABNORMAL HIGH (ref 65–99)
Potassium: 2.9 mmol/L — ABNORMAL LOW (ref 3.5–5.1)
SODIUM: 138 mmol/L (ref 135–145)

## 2014-12-20 LAB — GLUCOSE, CAPILLARY
GLUCOSE-CAPILLARY: 127 mg/dL — AB (ref 65–99)
GLUCOSE-CAPILLARY: 175 mg/dL — AB (ref 65–99)
Glucose-Capillary: 135 mg/dL — ABNORMAL HIGH (ref 65–99)
Glucose-Capillary: 159 mg/dL — ABNORMAL HIGH (ref 65–99)

## 2014-12-20 MED ORDER — POTASSIUM CHLORIDE CRYS ER 20 MEQ PO TBCR
20.0000 meq | EXTENDED_RELEASE_TABLET | Freq: Two times a day (BID) | ORAL | Status: DC
  Administered 2014-12-20 – 2014-12-21 (×3): 20 meq via ORAL
  Filled 2014-12-20 (×3): qty 1

## 2014-12-20 NOTE — Evaluation (Signed)
Occupational Therapy Evaluation Patient Details Name: Amanda Odonnell MRN: [id number] DOB: 1950/09/03 Today's Date: 12/20/2014    History of Present Illness 64 y.o. s/p Rt TSA. History of right hip fracture and surgery on left elbow.   Clinical Impression   Pt s/p above. Pt independent with ADLs, PTA. Feel pt will benefit from acute OT to address RUE and increase independence prior to d/c.     Follow Up Recommendations  No OT follow up;Supervision - Intermittent    Equipment Recommendations  Other (comment) (TBD)    Recommendations for Other Services       Precautions / Restrictions Precautions Precautions: Shoulder Type of Shoulder Precautions: Conservative Protocol: AROM elbow, wrist, hand; lap slides; gentle IR to abdomen/ER to 0 degrees, otherwise no AROM/PROM of shoulder; no pushing, pulling, lifting with RUE (can use to hold light items) Shoulder Interventions: Shoulder sling/immobilizer;At all times;Off for dressing/bathing/exercises Precaution Booklet Issued: Yes (comment) Precaution Comments: educated on shoulder precautions Required Braces or Orthoses: Sling Restrictions Weight Bearing Restrictions: Yes RUE Weight Bearing: Non weight bearing      Mobility Bed Mobility Overal bed mobility: Needs Assistance Bed Mobility: Supine to Sit;Sit to Supine     Supine to sit: Supervision Sit to supine: Supervision      Transfers Overall transfer level: Modified independent                    Balance        No apparent balance deficits-balance not formally assessed                                    ADL Overall ADL's : Needs assistance/impaired Eating/Feeding: Independent;Bed level; Sitting Eating/Feeding Details (indicate cue type and reason): took meds standing in session                     Toilet Transfer: Supervision/safety;Ambulation (Modified independent-sit to stand transfer)           Functional mobility during  ADLs: Supervision/safety General ADL Comments: Reviewed information on shoulder handout.     Vision     Perception     Praxis      Pertinent Vitals/Pain Pain Assessment: 0-10 Pain Score:  (3-4) Pain Location: right shoulder Pain Descriptors / Indicators: Constant Pain Intervention(s): Monitored during session;Repositioned     Hand Dominance Right   Extremity/Trunk Assessment Upper Extremity Assessment Upper Extremity Assessment: RUE deficits/detail RUE Deficits / Details: elbow,wrist, hand AROM-WFL    Lower Extremity Assessment Lower Extremity Assessment: Overall WFL for tasks assessed       Communication Communication Communication: No difficulties   Cognition Arousal/Alertness: Awake/alert Behavior During Therapy: WFL for tasks assessed/performed Overall Cognitive Status: Within Functional Limits for tasks assessed                     General Comments       Exercises Exercises: Other exercises;Shoulder Other Exercises Other Exercises: Pt performed approximately 10 reps each of right elbow flexion/extension (AROM), right digit composite flexion/extension (AROM), and right wrist flexion/extension (right). Other Exercises: Pt performed right lap slides and AROM right IR and ER (instructed on 0 degrees ER)   Shoulder Instructions Shoulder Instructions Donning/doffing shirt without moving shoulder:  (educated and discussed UB clothing) Method for sponge bathing under operated UE:  (educated) Donning/doffing sling/immobilizer:  (educated) Correct positioning of sling/immobilizer:  (educated) ROM for elbow, wrist and digits of  operated UE:  (educated) Sling wearing schedule (on at all times/off for ADL's):  (educated) Proper positioning of operated UE when showering:  (educated) Positioning of UE while sleeping:  (educated)    Home Living Family/patient expects to be discharged to:: Private residence Living Arrangements: Alone Available Help at  Discharge: Family;Available 24 hours/day (family staying 24/7 for a few days) Type of Home: Other(Comment) (townhome) Home Access: Stairs to enter [employer] of Steps: 3 Entrance Stairs-Rails: Right;Left;Can reach both Home Layout: Multi-level (can stay on main level on sofa/chair) Alternate Level Stairs-Number of Steps: 13 Alternate Level Stairs-Rails: Right Bathroom Shower/Tub: Producer, television/film/video: Standard     Home Equipment: Walker - standard;Cane - single point          Prior Functioning/Environment Level of Independence: Independent with assistive device(s)        Comments: used cane at times    OT Diagnosis: Acute pain   OT Problem List: Pain;Impaired UE functional use;Decreased knowledge of precautions;Decreased knowledge of use of DME or AE   OT Treatment/Interventions: Self-care/ADL training;Therapeutic exercise;DME and/or AE instruction;Therapeutic activities;Patient/family education;Balance training    OT Goals(Current goals can be found in the care plan section) Acute Rehab OT Goals Patient Stated Goal: not stated OT Goal Formulation: With patient Time For Goal Achievement: 12/27/14 Potential to Achieve Goals: Good ADL Goals Additional ADL Goal #1: Pt/caregiver will be independent with dressing/bathing while maintaining shoulder precautions. Additional ADL Goal #2: Pt will be independent with HEP for RUE.   OT Frequency: Min 2X/week   Barriers to D/C:            Co-evaluation              End of Session Equipment Utilized During Treatment: Other (comment) (sling; O2 placed back on at end of session)  Activity Tolerance: Patient tolerated treatment well Patient left: in bed;with call bell/phone within reach;with SCD's reapplied   Time: 1030-1051 OT Time Calculation (min): 21 min Charges:  OT General Charges $OT Visit: 1 Procedure OT Evaluation $Initial OT Evaluation Tier I: 1 Procedure G-CodesEarlie Raveling OTR/L [id number] 12/20/2014, 11:02 AM

## 2014-12-20 NOTE — Op Note (Signed)
Amanda Odonnell, DUTTA              ACCOUNT NO.:  [id number]  MEDICAL RECORD NO.:  [id number]  LOCATION:  5N27C                        FACILITY:  [facility]  PHYSICIAN:  Almedia Balls. Ranell Patrick, M.D. DATE OF BIRTH:  10/02/1950  DATE OF PROCEDURE:  12/19/2014 DATE OF DISCHARGE:                              OPERATIVE REPORT   PREOPERATIVE DIAGNOSIS:  Right shoulder end-stage osteoarthritis.  POSTOPERATIVE DIAGNOSIS:  Right shoulder end-stage osteoarthritis.  PROCEDURE PERFORMED:  Right total shoulder arthroplasty using DePuy Global Unite prosthesis.  ATTENDING SURGEON:  Almedia Balls. Ranell Patrick, M.D.  ASSISTANT:  Konrad Felix Dixon, [place], who was scrubbed the entire procedure and necessary for satisfactory completion of surgery.  ANESTHESIA:  General anesthesia was used plus interscalene block.  ESTIMATED BLOOD LOSS:  250 mL.  FLUID REPLACEMENT:  1500 mL of crystalloid.  INSTRUMENT COUNTS:  Correct.  COMPLICATIONS:  There were no complications.  ANTIBIOTICS:  Perioperative antibiotics were given.  INDICATIONS:  The patient is a 64 year old female with debilitating right shoulder pain and loss of range of motion and function secondary to severe end-stage arthritis.  The patient presents with bone-on-bone on x-ray and MRI scan and evidence of thinning of the rotator cuff, concerning for impending rotator cuff tearing.  The patient's motion is very poor and nonfunctional.  At this point, given the progression of pain despite conservative management, she has elected to proceed with the shoulder replacement surgery.  Risks and benefits of the surgery were discussed.  Informed consent obtained.  DESCRIPTION OF PROCEDURE:  After an adequate level of anesthesia was achieved, the patient was positioned in the modified beach-chair position.  Right shoulder was correctly identified and sterilely prepped and draped in the usual manner.  Time-out was called.  We entered the shoulder using standard  deltopectoral incision starting at coracoid process extending down to the anterior humerus.  Dissection was carried down through the subcutaneous tissues using the Bovie electrocautery, identified the cephalic vein, took it laterally with the deltoid and pectoralis taken medially.  The conjoined tendon was identified and retracted.  Subscapularis was released subperiosteally off the lesser tuberosity and tagged for repair at the end with #2 FiberWire suture. We released the capsule off the humerus as we externally rotated.  We then placed our Crego elevators to prepare for the head cut.  We placed our neck resection guide and we were protecting the rotator cuff and then resected the head in 20 degrees of external rotation with the elbow at the site, which was 20 degrees of retroversion cut.  Once we had that cut done, we removed the osteophytes using large rongeur careful to protect the axillary nerve.  We then extended the shoulder, delivered the humerus such that we could prepare the canal.  We prepared the canal with sequential reaming up to size 10 canal diameter.  Next, we went ahead and broached for the size 10 Global Unite stem.  Once we had that broach completed, we placed our trial in place and we were sure that it could be fully seated.  We then removed the trial, we reduced the shoulder and then put that humerus posteriorly retracting such that we could do a 360 degrees labral removal and  capsular release.  There was a very very large osteophyte anteroinferiorly, which we removed using osteotomes in a sequential manner careful not to remove any native bone. Once we got it back to the patient's native glenoid, we went ahead and used a high-speed burr to burr down the high side as this was significantly retroverted.  We then placed our central guidepin, correcting the version and then reamed down to the size 40 glenoid.  We used our peripheral hand reamer.  Once we were happy with  that, we drilled out our central peg hole for the anchor peg glenoid and then our three peripheral holes with the Gold guide.  We then placed our 40 trial, impacted that and it was stable.  We removed our trial, used epi- soaked Gelfoam around the peripheral holes and then using Smart DePuy 1 cement, we cemented the three peripheral holes and placed the APG glenoid in place.  It was again stable in its.  Once fully seated, there was no rocking, we allowed the cement to fully harden before we removed our retractors.  We then irrigated thoroughly.  Next, we noted there to be thinning and some attritional tearing of the anterior rotator cuff involving supraspinatus and infraspinatus, so we took mattress sutures, the #2 FiberWire suture and then took them down through the greater tuberosity from inside to outside to further reinforce our rotator cuff so that would not tear on this.  Once we had these two mattress sutures placed, one in the supraspinatus and one in the infraspinatus, then we went ahead and irrigated thoroughly.  We initially planned on a press- fit.  We impacted our press-fit stem into position and then put our 40 x 15 eccentric head on, reduced the shoulder and we were extremely happy with soft tissue balancing and alignment and stability, but when we were trying to remove the humeral head trial, the actual stem backed out of the canal, thus we selected some DePuy HV cement, mixed up one batch and mixed on the back table.  We then placed that down the canal after we dried it and irrigated it thoroughly and also cleaned off the stem and then placed that stem in basically a hybrid manner, so we had our [place] proximally that was still exposed and we used some impaction grafting proximally and then had the cement distally.  So, we again impacted in 20 degrees of retroversion.  We were pleased with that stability, we then selected a 40 x 15 eccentric head and impacted that with  eccentricity dial directly north to the greater tuberosity and then reduced the shoulder, again happy with our balancing and then repaired the subscap anatomically back to bone and the rotator interval was repaired as well.  We had nice solid shoulder, very pleased with the stability of the shoulder and the range of motion.  We then irrigated thoroughly and closed deltopectoral interval with 0 Vicryl suture followed by 2-0 Vicryl subcutaneous closure and 4-0 Monocryl for skin. The patient was awakened and taken to the recovery room in stable condition.     Almedia Balls. Ranell Patrick, M.D.     SRN/MEDQ  D:  12/19/2014  T:  12/20/2014  Job:  [id number]

## 2014-12-20 NOTE — Progress Notes (Signed)
   Subjective: 1 Day Post-Op Procedure(s) (LRB): TOTAL SHOULDER ARTHROPLASTY (Right)  Pt c/o some soreness and problems getting comfortable this morning Sling in place Suspect d/c tomorrow Patient reports pain as moderate.  Objective:   VITALS:   Filed Vitals:   12/20/14 0459  BP: 122/56  Pulse: 77  Temp: 98.5 F (36.9 C)  Resp: 18    Right shoulder dressing and sling in place nv intact distally No rashes or edema  LABS  Recent Labs  12/20/14 0432  HGB 11.1*  HCT 34.2*     Recent Labs  12/20/14 0432  NA 138  K 2.9*  BUN 10  CREATININE 0.69  GLUCOSE 150*     Assessment/Plan: 1 Day Post-Op Procedure(s) (LRB): TOTAL SHOULDER ARTHROPLASTY (Right) Hypokalemia - will order some oral potassium today Plan for d/c probably tomorrow once family comes into town PT/OT Pulmonary toilet    Alphonsa Overall, MPAS, PA-C  12/20/2014, 7:19 AM

## 2014-12-21 LAB — GLUCOSE, CAPILLARY: Glucose-Capillary: 124 mg/dL — ABNORMAL HIGH (ref 65–99)

## 2014-12-21 NOTE — Progress Notes (Signed)
    Subjective: 2 Days Post-Op Procedure(s) (LRB): TOTAL SHOULDER ARTHROPLASTY (Right) Patient reports pain as 2 on 0-10 scale.   Denies CP or SOB.  Voiding without difficulty. Positive flatus. Objective: Vital signs in last 24 hours: Temp:  [98.4 F (36.9 C)-100.3 F (37.9 C)] 99.5 F (37.5 C) (10/09 0541) Pulse Rate:  [78-83] 80 (10/09 0541) Resp:  [16-18] 17 (10/09 0541) BP: (115-152)/(53-69) 152/69 mmHg (10/09 0541) SpO2:  [93 %-99 %] 99 % (10/09 0541)  Intake/Output from previous day: 10/08 0701 - 10/09 0700 In: 980 [P.O.:480; I.V.:500] Out: -  Intake/Output this shift:    Labs:  Recent Labs  12/20/14 0432  HGB 11.1*    Recent Labs  12/20/14 0432  HCT 34.2*    Recent Labs  12/20/14 0432  NA 138  K 2.9*  CL 101  CO2 28  BUN 10  CREATININE 0.69  GLUCOSE 150*  CALCIUM 8.9   No results for input(s): LABPT, INR in the last 72 hours.  Physical Exam: Neurologically intact ABD soft Intact pulses distally Incision: dressing C/D/I Compartment soft  Assessment/Plan: 2 Days Post-Op Procedure(s) (LRB): TOTAL SHOULDER ARTHROPLASTY (Right) Doing well overall Plan on d/c to home F/u with Dr Ranell Patrick as arranged  Venita Lick D for Dr. Venita Lick [facility] [phone] 12/21/2014, 8:22 AM

## 2014-12-21 NOTE — Care Management Note (Signed)
Case Management Note  Patient Details  Name: Amanda Odonnell MRN: [id number] Date of Birth: 1950/10/10  Subjective/Objective:                  TOTAL SHOULDER ARTHROPLASTY (Right)  Action/Plan: CM spoke to patient at the bedside about discharge plan. CM introduced self and role. Pt says that she has no HH needs. Pt has walker, cane, and crutches. Pt planning to obtain shower bench on her own. Pt says that she has plenty of family and friend support after discharge. No difficulty obtaining medications per pt. No further discharge planning needs identified.   Expected Discharge Date:  12/21/14               Expected Discharge Plan:  Home/Self Care  In-House Referral:     Discharge planning Services  CM Consult  Post Acute Care Choice:    Choice offered to:  NA  DME Arranged:  N/A DME Agency:  NA  HH Arranged:  NA HH Agency:     Status of Service:  Completed, signed off  Medicare Important Message Given:    Date Medicare IM Given:    Medicare IM give by:    Date Additional Medicare IM Given:    Additional Medicare Important Message give by:     If discussed at Long Length of Stay Meetings, dates discussed:    Additional Comments:  Darcel Smalling, RN 12/21/2014, 9:42 AM

## 2014-12-21 NOTE — Progress Notes (Signed)
Occupational Therapy Treatment Patient Details Name: SHELONDA SAXE MRN: [id number] DOB: 02/09/1951 Today's Date: 12/21/2014    History of present illness 64 y.o. s/p Rt TSA. History of right hip fracture and surgery on left elbow.   OT comments  Education provided in session and feel pt is safe to d/c home, from OT standpoint.   Follow Up Recommendations  No OT follow up;Supervision - Intermittent    Equipment Recommendations  Tub/shower seat-pt is planning to get on her own   Recommendations for Other Services      Precautions / Restrictions Precautions Precautions: Shoulder Type of Shoulder Precautions: Conservative Protocol: AROM elbow, wrist, hand; lap slides; gentle IR to abdomen/ER to 0 degrees, otherwise no AROM/PROM of shoulder; no pushing, pulling, lifting with RUE (can use to hold light items) Shoulder Interventions: Shoulder sling/immobilizer;At all times;Off for dressing/bathing/exercises Precaution Booklet Issued: Yes (comment) (given yesterday) Precaution Comments: educated on shoulder precautions Required Braces or Orthoses: Sling Restrictions Weight Bearing Restrictions: Yes RUE Weight Bearing: Non weight bearing       Mobility Bed Mobility Overal bed mobility: Modified Independent                Transfers Overall transfer level: Modified independent                    Balance    No LOB in session.                               ADL Overall ADL's : Needs assistance/impaired Eating/Feeding: Independent;Sitting               Upper Body Dressing : Moderate assistance;Sitting   Lower Body Dressing: Minimal assistance;Sit to/from stand Lower Body Dressing Details (indicate cue type and reason): donned panties and pants Toilet Transfer: Modified Independent;Ambulation (sit to stand from bed)           Functional mobility during ADLs: Modified independent General ADL Comments: Educated on safety such as sitting for  LB bathing and discussed options for shower chair. Discussed 3 in 1. Recommended someone be with her for showering and shower transfer.       Vision                     Perception     Praxis      Cognition  Awake/Alert Behavior During Therapy: WFL for tasks assessed/performed Overall Cognitive Status: Within Functional Limits for tasks assessed                       Extremity/Trunk Assessment               Exercises Other Exercises Other Exercises: Pt performed approximately 10 reps each of right elbow flexion/extension (AROM), right digit composite flexion/extension (AROM), and right wrist flexion/extension (right). Other Exercises: Pt performed right lap slides and AROM right IR and ER (instructed on 0 degrees ER) Donning/doffing shirt without moving shoulder: Moderate assistance Method for sponge bathing under operated UE:  (pt able to verbalize) Donning/doffing sling/immobilizer: Moderate assistance Correct positioning of sling/immobilizer: Moderate assistance ROM for elbow, wrist and digits of operated UE: Supervision/safety Sling wearing schedule (on at all times/off for ADL's): Supervision/safety (pt able to state 2/3 exceptions independently) Proper positioning of operated UE when showering:  (pt able to verbalize) Positioning of UE while sleeping:  (pt able to verbalize)   Shoulder Instructions Shoulder Instructions Donning/doffing shirt  without moving shoulder: Moderate assistance Method for sponge bathing under operated UE:  (pt able to verbalize) Donning/doffing sling/immobilizer: Moderate assistance Correct positioning of sling/immobilizer: Minimal assistance ROM for elbow, wrist and digits of operated UE: Supervision/safety Sling wearing schedule (on at all times/off for ADL's): Supervision/safety (pt able to state 2/3 exceptions independently) Proper positioning of operated UE when showering:  (pt able to verbalize) Positioning of UE while  sleeping:  (pt able to verbalize)     General Comments      Pertinent Vitals/ Pain       Pain Assessment: 0-10 Pain Score:  (2-3) Pain Location: RUE Pain Descriptors / Indicators: Aching Pain Intervention(s): Monitored during session;Ice applied;Repositioned  Home Living                                          Prior Functioning/Environment              Frequency Min 2X/week     Progress Toward Goals  OT Goals(current goals can now be found in the care plan section)  Progress towards OT goals: Progressing toward goals  Acute Rehab OT Goals Patient Stated Goal: not stated OT Goal Formulation: With patient Time For Goal Achievement: 12/27/14 Potential to Achieve Goals: Good ADL Goals Additional ADL Goal #1: Pt/caregiver will be independent with dressing/bathing while maintaining shoulder precautions. Additional ADL Goal #2: Pt will be independent with HEP for RUE.   Plan Discharge plan remains appropriate    Co-evaluation                 End of Session Equipment Utilized During Treatment: Other (comment);Oxygen (sling; Oxygen used part of session)   Activity Tolerance Patient tolerated treatment well   Patient Left in chair;with call bell/phone within reach (respiratory coming in room)   Nurse Communication          Time: 1610-9604 OT Time Calculation (min): 19 min  Charges: OT General Charges $OT Visit: 1 Procedure OT Treatments $Self Care/Home Management : 8-22 mins  Earlie Raveling OTR/L [phone] 12/21/2014, 9:01 AM

## 2014-12-22 ENCOUNTER — Encounter (HOSPITAL_COMMUNITY): Payer: Self-pay | Admitting: Orthopedic Surgery

## 2014-12-25 NOTE — Discharge Summary (Signed)
Physician Discharge Summary   Patient ID: Amanda Odonnell MRN: [id number] DOB/AGE: 1950-10-21 64 y.o.  Admit date: 12/19/2014 Discharge date: 12/25/2014  Admission Diagnoses:  Active Problems:   S/P shoulder replacement   Discharge Diagnoses:  Same   Surgeries: Procedure(s): TOTAL SHOULDER ARTHROPLASTY on 12/19/2014   Consultants: OT  Discharged Condition: Stable  Hospital Course: Amanda Odonnell is an 64 y.o. 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  They were brought to the operating room on 12/19/2014 and underwent the above named procedures.    The patient had an uncomplicated hospital course and was stable for discharge.  Recent vital signs:  Filed Vitals:   12/21/14 0541  BP: 152/69  Pulse: 80  Temp: 99.5 F (37.5 C)  Resp: 17    Recent laboratory studies:  Results for orders placed or performed during the hospital encounter of 12/19/14  Glucose, capillary  Result Value Ref Range   Glucose-Capillary 106 (H) 65 - 99 mg/dL   Comment 1 Notify RN    Comment 2 Document in Chart   Glucose, capillary  Result Value Ref Range   Glucose-Capillary 120 (H) 65 - 99 mg/dL  Hemoglobin and hematocrit, blood  Result Value Ref Range   Hemoglobin 11.1 (L) 12.0 - 15.0 g/dL   HCT 57.8 (L) 46.9 - 62.9 %  Basic metabolic panel  Result Value Ref Range   Sodium 138 135 - 145 mmol/L   Potassium 2.9 (L) 3.5 - 5.1 mmol/L   Chloride 101 101 - 111 mmol/L   CO2 28 22 - 32 mmol/L   Glucose, Bld 150 (H) 65 - 99 mg/dL   BUN 10 6 - 20 mg/dL   Creatinine, Ser 5.28 0.44 - 1.00 mg/dL   Calcium 8.9 8.9 - 41.3 mg/dL   GFR calc non Af Amer >60 >60 mL/min   GFR calc Af Amer >60 >60 mL/min   Anion gap 9 5 - 15  Glucose, capillary  Result Value Ref Range   Glucose-Capillary 128 (H) 65 - 99 mg/dL  Glucose, capillary  Result Value Ref Range   Glucose-Capillary 127 (H) 65 - 99 mg/dL  Glucose,  capillary  Result Value Ref Range   Glucose-Capillary 175 (H) 65 - 99 mg/dL  Glucose, capillary  Result Value Ref Range   Glucose-Capillary 135 (H) 65 - 99 mg/dL  Glucose, capillary  Result Value Ref Range   Glucose-Capillary 159 (H) 65 - 99 mg/dL  Glucose, capillary  Result Value Ref Range   Glucose-Capillary 124 (H) 65 - 99 mg/dL    Discharge Medications:     Medication List    STOP taking these medications        ibuprofen 200 MG tablet  Commonly known as:  ADVIL,MOTRIN     naproxen sodium 220 MG tablet  Commonly known as:  ANAPROX      TAKE these medications        acetaminophen 650 MG CR tablet  Commonly known as:  TYLENOL  Take 1,300 mg by mouth every 8 (eight) hours as needed for pain.     albuterol 108 (90 BASE) MCG/ACT inhaler  Commonly known as:  PROVENTIL HFA;VENTOLIN HFA  Inhale 2 puffs into the lungs daily.     aspirin 81 MG tablet  Take 81 mg by mouth daily.     beta carotene 24401 UNIT capsule  Take 25,000 Units by mouth daily.  CALCIUM 500 PO  Take 1 tablet by mouth 2 (two) times daily.     calcium carbonate 500 MG chewable tablet  Commonly known as:  TUMS - dosed in mg elemental calcium  Chew 1 tablet by mouth daily as needed for indigestion or heartburn.     cholecalciferol 1000 UNITS tablet  Commonly known as:  VITAMIN D  Take 1,000 Units by mouth daily.     Cinnamon 500 MG capsule  Take 500 mg by mouth 2 (two) times daily.     Co Q 10 100 MG Caps  Take 1 capsule by mouth daily.     diphenhydrAMINE 25 mg capsule  Commonly known as:  BENADRYL  Take 25 mg by mouth every 6 (six) hours as needed for allergies.     ferrous sulfate 325 (65 FE) MG tablet  Take 325 mg by mouth daily with breakfast.     Fish Oil 500 MG Caps  Take 1 capsule by mouth 2 (two) times daily.     Fluticasone-Salmeterol 100-50 MCG/DOSE Aepb  Commonly known as:  ADVAIR  Inhale 1 puff into the lungs 2 (two) times daily.     HYDROcodone-acetaminophen 5-325  MG tablet  Commonly known as:  NORCO  Take 1-2 tablets by mouth every 6 (six) hours as needed for moderate pain.     indapamide 2.5 MG tablet  Commonly known as:  LOZOL  Take 2.5 mg by mouth daily.     latanoprost 0.005 % ophthalmic solution  Commonly known as:  XALATAN  Place 1 drop into both eyes at bedtime.     Magnesium 250 MG Tabs  Take 1 tablet by mouth daily.     metFORMIN 500 MG tablet  Commonly known as:  GLUCOPHAGE  Take 250-500 mg by mouth daily. With evening meal     methocarbamol 500 MG tablet  Commonly known as:  ROBAXIN  Take 1 tablet (500 mg total) by mouth 3 (three) times daily as needed.     methocarbamol 500 MG tablet  Commonly known as:  ROBAXIN  Take 500 mg by mouth every 6 (six) hours as needed for muscle spasms.     montelukast 10 MG tablet  Commonly known as:  SINGULAIR  Take 10 mg by mouth at bedtime.     multivitamin with minerals tablet  Take 1 tablet by mouth daily.     ranitidine 150 MG capsule  Commonly known as:  ZANTAC  Take 150 mg by mouth every evening.     rosuvastatin 40 MG tablet  Commonly known as:  CRESTOR  Take 20 mg by mouth at bedtime. Take 1/2 tablet once daily.     sertraline 100 MG tablet  Commonly known as:  ZOLOFT  Take 100 mg by mouth daily.     SIMBRINZA 1-0.2 % Susp  Generic drug:  Brinzolamide-Brimonidine  Place 1 drop into both eyes 2 (two) times daily.     verapamil 360 MG 24 hr capsule  Commonly known as:  VERELAN PM  Take 360 mg by mouth every morning.     vitamin C 500 MG tablet  Commonly known as:  ASCORBIC ACID  Take 500 mg by mouth daily.        Diagnostic Studies: Dg Shoulder Right Port  12/19/2014  CLINICAL DATA:  Postop right shoulder replacement EXAM: PORTABLE RIGHT SHOULDER - 2+ VIEW COMPARISON:  None. FINDINGS: Changes of right shoulder replacement. Normal alignment. No hardware or bony complicating feature. IMPRESSION: Right shoulder replacement without  complicating feature. Electronically  Signed   By: Charlett Nose M.D.   On: 12/19/2014 11:39    Disposition: 01-Home or Self Care        Follow-up Information    Follow up with Amanda Odonnell,STEVEN R, MD. Call in 2 weeks.   Specialty:  Orthopedic Surgery   Why:  [phone]   Contact information:   [address] [place] [address] [phone]        Signed: Verlee Rossetti 12/25/2014, 12:34 PM

## 2015-01-01 DIAGNOSIS — M19011 Primary osteoarthritis, right shoulder: Secondary | ICD-10-CM | POA: Diagnosis not present

## 2015-01-01 DIAGNOSIS — Z471 Aftercare following joint replacement surgery: Secondary | ICD-10-CM | POA: Diagnosis not present

## 2015-01-01 DIAGNOSIS — Z96611 Presence of right artificial shoulder joint: Secondary | ICD-10-CM | POA: Diagnosis not present

## 2015-01-06 DIAGNOSIS — M19011 Primary osteoarthritis, right shoulder: Secondary | ICD-10-CM | POA: Diagnosis not present

## 2015-01-09 DIAGNOSIS — M19011 Primary osteoarthritis, right shoulder: Secondary | ICD-10-CM | POA: Diagnosis not present

## 2015-01-13 DIAGNOSIS — M19011 Primary osteoarthritis, right shoulder: Secondary | ICD-10-CM | POA: Diagnosis not present

## 2015-01-15 DIAGNOSIS — M19011 Primary osteoarthritis, right shoulder: Secondary | ICD-10-CM | POA: Diagnosis not present

## 2015-01-19 DIAGNOSIS — M19011 Primary osteoarthritis, right shoulder: Secondary | ICD-10-CM | POA: Diagnosis not present

## 2015-01-19 DIAGNOSIS — H401131 Primary open-angle glaucoma, bilateral, mild stage: Secondary | ICD-10-CM | POA: Diagnosis not present

## 2015-01-22 DIAGNOSIS — M19011 Primary osteoarthritis, right shoulder: Secondary | ICD-10-CM | POA: Diagnosis not present

## 2015-01-26 DIAGNOSIS — M19011 Primary osteoarthritis, right shoulder: Secondary | ICD-10-CM | POA: Diagnosis not present

## 2015-01-29 DIAGNOSIS — Z96611 Presence of right artificial shoulder joint: Secondary | ICD-10-CM | POA: Diagnosis not present

## 2015-01-29 DIAGNOSIS — Z471 Aftercare following joint replacement surgery: Secondary | ICD-10-CM | POA: Diagnosis not present

## 2015-01-29 DIAGNOSIS — M19011 Primary osteoarthritis, right shoulder: Secondary | ICD-10-CM | POA: Diagnosis not present

## 2015-02-03 DIAGNOSIS — M19011 Primary osteoarthritis, right shoulder: Secondary | ICD-10-CM | POA: Diagnosis not present

## 2015-02-10 DIAGNOSIS — M19011 Primary osteoarthritis, right shoulder: Secondary | ICD-10-CM | POA: Diagnosis not present

## 2015-02-12 DIAGNOSIS — M19011 Primary osteoarthritis, right shoulder: Secondary | ICD-10-CM | POA: Diagnosis not present

## 2015-02-16 DIAGNOSIS — M19011 Primary osteoarthritis, right shoulder: Secondary | ICD-10-CM | POA: Diagnosis not present

## 2015-02-19 DIAGNOSIS — M19011 Primary osteoarthritis, right shoulder: Secondary | ICD-10-CM | POA: Diagnosis not present

## 2015-02-23 DIAGNOSIS — M19011 Primary osteoarthritis, right shoulder: Secondary | ICD-10-CM | POA: Diagnosis not present

## 2015-02-27 DIAGNOSIS — M19011 Primary osteoarthritis, right shoulder: Secondary | ICD-10-CM | POA: Diagnosis not present

## 2015-03-02 DIAGNOSIS — M19011 Primary osteoarthritis, right shoulder: Secondary | ICD-10-CM | POA: Diagnosis not present

## 2015-03-05 DIAGNOSIS — M19011 Primary osteoarthritis, right shoulder: Secondary | ICD-10-CM | POA: Diagnosis not present

## 2015-03-10 DIAGNOSIS — M19011 Primary osteoarthritis, right shoulder: Secondary | ICD-10-CM | POA: Diagnosis not present

## 2015-03-18 DIAGNOSIS — Z96611 Presence of right artificial shoulder joint: Secondary | ICD-10-CM | POA: Diagnosis not present

## 2015-03-18 DIAGNOSIS — Z471 Aftercare following joint replacement surgery: Secondary | ICD-10-CM | POA: Diagnosis not present

## 2015-05-06 DIAGNOSIS — E559 Vitamin D deficiency, unspecified: Secondary | ICD-10-CM | POA: Diagnosis not present

## 2015-05-06 DIAGNOSIS — I10 Essential (primary) hypertension: Secondary | ICD-10-CM | POA: Diagnosis not present

## 2015-05-06 DIAGNOSIS — E119 Type 2 diabetes mellitus without complications: Secondary | ICD-10-CM | POA: Diagnosis not present

## 2015-05-11 DIAGNOSIS — E7800 Pure hypercholesterolemia, unspecified: Secondary | ICD-10-CM | POA: Diagnosis not present

## 2015-05-11 DIAGNOSIS — E559 Vitamin D deficiency, unspecified: Secondary | ICD-10-CM | POA: Diagnosis not present

## 2015-05-11 DIAGNOSIS — E119 Type 2 diabetes mellitus without complications: Secondary | ICD-10-CM | POA: Diagnosis not present

## 2015-05-11 DIAGNOSIS — I10 Essential (primary) hypertension: Secondary | ICD-10-CM | POA: Diagnosis not present

## 2015-05-18 DIAGNOSIS — H401111 Primary open-angle glaucoma, right eye, mild stage: Secondary | ICD-10-CM | POA: Diagnosis not present

## 2015-06-24 DIAGNOSIS — Z96611 Presence of right artificial shoulder joint: Secondary | ICD-10-CM | POA: Diagnosis not present

## 2015-06-24 DIAGNOSIS — M19011 Primary osteoarthritis, right shoulder: Secondary | ICD-10-CM | POA: Diagnosis not present

## 2015-06-24 DIAGNOSIS — Z471 Aftercare following joint replacement surgery: Secondary | ICD-10-CM | POA: Diagnosis not present

## 2015-09-21 DIAGNOSIS — H401131 Primary open-angle glaucoma, bilateral, mild stage: Secondary | ICD-10-CM | POA: Diagnosis not present

## 2015-11-04 ENCOUNTER — Other Ambulatory Visit: Payer: Self-pay | Admitting: Internal Medicine

## 2015-11-04 DIAGNOSIS — E119 Type 2 diabetes mellitus without complications: Secondary | ICD-10-CM | POA: Diagnosis not present

## 2015-11-04 DIAGNOSIS — Z1231 Encounter for screening mammogram for malignant neoplasm of breast: Secondary | ICD-10-CM

## 2015-11-04 DIAGNOSIS — I10 Essential (primary) hypertension: Secondary | ICD-10-CM | POA: Diagnosis not present

## 2015-11-09 ENCOUNTER — Ambulatory Visit
Admission: RE | Admit: 2015-11-09 | Discharge: 2015-11-09 | Disposition: A | Discharge status: Home / Self Care | Payer: Medicare Other | Source: Ambulatory Visit | Attending: Internal Medicine | Admitting: Internal Medicine

## 2015-11-09 DIAGNOSIS — Z0000 Encounter for general adult medical examination without abnormal findings: Secondary | ICD-10-CM | POA: Diagnosis not present

## 2015-11-09 DIAGNOSIS — Z1231 Encounter for screening mammogram for malignant neoplasm of breast: Secondary | ICD-10-CM

## 2015-11-09 DIAGNOSIS — E119 Type 2 diabetes mellitus without complications: Secondary | ICD-10-CM | POA: Diagnosis not present

## 2015-11-09 DIAGNOSIS — E559 Vitamin D deficiency, unspecified: Secondary | ICD-10-CM | POA: Diagnosis not present

## 2015-11-09 DIAGNOSIS — E7800 Pure hypercholesterolemia, unspecified: Secondary | ICD-10-CM | POA: Diagnosis not present

## 2015-11-27 DIAGNOSIS — Z23 Encounter for immunization: Secondary | ICD-10-CM | POA: Diagnosis not present

## 2015-12-25 DIAGNOSIS — M1612 Unilateral primary osteoarthritis, left hip: Secondary | ICD-10-CM | POA: Diagnosis not present

## 2016-01-14 DIAGNOSIS — M1612 Unilateral primary osteoarthritis, left hip: Secondary | ICD-10-CM | POA: Diagnosis not present

## 2016-01-18 DIAGNOSIS — H401132 Primary open-angle glaucoma, bilateral, moderate stage: Secondary | ICD-10-CM | POA: Diagnosis not present

## 2016-01-18 DIAGNOSIS — H2513 Age-related nuclear cataract, bilateral: Secondary | ICD-10-CM | POA: Diagnosis not present

## 2016-01-28 DIAGNOSIS — M1612 Unilateral primary osteoarthritis, left hip: Secondary | ICD-10-CM | POA: Diagnosis not present

## 2016-02-12 DIAGNOSIS — H25812 Combined forms of age-related cataract, left eye: Secondary | ICD-10-CM | POA: Diagnosis not present

## 2016-02-12 DIAGNOSIS — H25811 Combined forms of age-related cataract, right eye: Secondary | ICD-10-CM | POA: Diagnosis not present

## 2016-02-25 DIAGNOSIS — H5213 Myopia, bilateral: Secondary | ICD-10-CM | POA: Diagnosis not present

## 2016-02-25 DIAGNOSIS — H44511 Absolute glaucoma, right eye: Secondary | ICD-10-CM | POA: Diagnosis not present

## 2016-02-25 DIAGNOSIS — H25811 Combined forms of age-related cataract, right eye: Secondary | ICD-10-CM | POA: Diagnosis not present

## 2016-02-25 DIAGNOSIS — H2511 Age-related nuclear cataract, right eye: Secondary | ICD-10-CM | POA: Diagnosis not present

## 2016-03-17 DIAGNOSIS — H25812 Combined forms of age-related cataract, left eye: Secondary | ICD-10-CM | POA: Diagnosis not present

## 2016-03-17 DIAGNOSIS — H401121 Primary open-angle glaucoma, left eye, mild stage: Secondary | ICD-10-CM | POA: Diagnosis not present

## 2016-03-17 DIAGNOSIS — H401131 Primary open-angle glaucoma, bilateral, mild stage: Secondary | ICD-10-CM | POA: Diagnosis not present

## 2016-03-18 DIAGNOSIS — H2512 Age-related nuclear cataract, left eye: Secondary | ICD-10-CM | POA: Diagnosis not present

## 2016-05-11 DIAGNOSIS — E559 Vitamin D deficiency, unspecified: Secondary | ICD-10-CM | POA: Diagnosis not present

## 2016-05-11 DIAGNOSIS — E7800 Pure hypercholesterolemia, unspecified: Secondary | ICD-10-CM | POA: Diagnosis not present

## 2016-05-11 DIAGNOSIS — E119 Type 2 diabetes mellitus without complications: Secondary | ICD-10-CM | POA: Diagnosis not present

## 2016-05-18 DIAGNOSIS — E7800 Pure hypercholesterolemia, unspecified: Secondary | ICD-10-CM | POA: Diagnosis not present

## 2016-05-18 DIAGNOSIS — Z0000 Encounter for general adult medical examination without abnormal findings: Secondary | ICD-10-CM | POA: Diagnosis not present

## 2016-05-18 DIAGNOSIS — E119 Type 2 diabetes mellitus without complications: Secondary | ICD-10-CM | POA: Diagnosis not present

## 2016-05-18 DIAGNOSIS — Z23 Encounter for immunization: Secondary | ICD-10-CM | POA: Diagnosis not present

## 2016-05-18 DIAGNOSIS — I10 Essential (primary) hypertension: Secondary | ICD-10-CM | POA: Diagnosis not present

## 2016-05-30 DIAGNOSIS — H401131 Primary open-angle glaucoma, bilateral, mild stage: Secondary | ICD-10-CM | POA: Diagnosis not present

## 2016-09-26 DIAGNOSIS — H401131 Primary open-angle glaucoma, bilateral, mild stage: Secondary | ICD-10-CM | POA: Diagnosis not present

## 2016-10-10 DIAGNOSIS — H401131 Primary open-angle glaucoma, bilateral, mild stage: Secondary | ICD-10-CM | POA: Diagnosis not present

## 2016-11-16 DIAGNOSIS — I10 Essential (primary) hypertension: Secondary | ICD-10-CM | POA: Diagnosis not present

## 2016-11-16 DIAGNOSIS — M8580 Other specified disorders of bone density and structure, unspecified site: Secondary | ICD-10-CM | POA: Diagnosis not present

## 2016-11-16 DIAGNOSIS — D509 Iron deficiency anemia, unspecified: Secondary | ICD-10-CM | POA: Diagnosis not present

## 2016-11-16 DIAGNOSIS — M859 Disorder of bone density and structure, unspecified: Secondary | ICD-10-CM | POA: Diagnosis not present

## 2016-11-16 DIAGNOSIS — E119 Type 2 diabetes mellitus without complications: Secondary | ICD-10-CM | POA: Diagnosis not present

## 2016-11-21 DIAGNOSIS — E7800 Pure hypercholesterolemia, unspecified: Secondary | ICD-10-CM | POA: Diagnosis not present

## 2016-11-21 DIAGNOSIS — E118 Type 2 diabetes mellitus with unspecified complications: Secondary | ICD-10-CM | POA: Diagnosis not present

## 2016-11-21 DIAGNOSIS — I10 Essential (primary) hypertension: Secondary | ICD-10-CM | POA: Diagnosis not present

## 2016-11-21 DIAGNOSIS — D649 Anemia, unspecified: Secondary | ICD-10-CM | POA: Diagnosis not present

## 2016-11-21 DIAGNOSIS — Z0000 Encounter for general adult medical examination without abnormal findings: Secondary | ICD-10-CM | POA: Diagnosis not present

## 2017-01-13 DIAGNOSIS — H401132 Primary open-angle glaucoma, bilateral, moderate stage: Secondary | ICD-10-CM | POA: Diagnosis not present

## 2017-03-13 ENCOUNTER — Encounter: Payer: Self-pay | Admitting: Gastroenterology

## 2017-05-17 DIAGNOSIS — H401132 Primary open-angle glaucoma, bilateral, moderate stage: Secondary | ICD-10-CM | POA: Diagnosis not present

## 2017-05-18 DIAGNOSIS — H4043X4 Glaucoma secondary to eye inflammation, bilateral, indeterminate stage: Secondary | ICD-10-CM | POA: Diagnosis not present

## 2017-05-22 DIAGNOSIS — H4043X4 Glaucoma secondary to eye inflammation, bilateral, indeterminate stage: Secondary | ICD-10-CM | POA: Diagnosis not present

## 2017-05-24 DIAGNOSIS — H4043X4 Glaucoma secondary to eye inflammation, bilateral, indeterminate stage: Secondary | ICD-10-CM | POA: Diagnosis not present

## 2017-06-01 DIAGNOSIS — H4043X4 Glaucoma secondary to eye inflammation, bilateral, indeterminate stage: Secondary | ICD-10-CM | POA: Diagnosis not present

## 2017-06-16 DIAGNOSIS — H4043X4 Glaucoma secondary to eye inflammation, bilateral, indeterminate stage: Secondary | ICD-10-CM | POA: Diagnosis not present

## 2017-07-25 DIAGNOSIS — R768 Other specified abnormal immunological findings in serum: Secondary | ICD-10-CM | POA: Diagnosis not present

## 2017-07-25 DIAGNOSIS — H209 Unspecified iridocyclitis: Secondary | ICD-10-CM | POA: Diagnosis not present

## 2017-07-25 DIAGNOSIS — H4043X4 Glaucoma secondary to eye inflammation, bilateral, indeterminate stage: Secondary | ICD-10-CM | POA: Diagnosis not present

## 2017-08-15 DIAGNOSIS — Z0000 Encounter for general adult medical examination without abnormal findings: Secondary | ICD-10-CM | POA: Diagnosis not present

## 2017-08-15 DIAGNOSIS — E559 Vitamin D deficiency, unspecified: Secondary | ICD-10-CM | POA: Diagnosis not present

## 2017-08-15 DIAGNOSIS — D649 Anemia, unspecified: Secondary | ICD-10-CM | POA: Diagnosis not present

## 2017-08-15 DIAGNOSIS — I10 Essential (primary) hypertension: Secondary | ICD-10-CM | POA: Diagnosis not present

## 2017-08-15 DIAGNOSIS — E118 Type 2 diabetes mellitus with unspecified complications: Secondary | ICD-10-CM | POA: Diagnosis not present

## 2017-08-23 ENCOUNTER — Other Ambulatory Visit: Payer: Self-pay | Admitting: Internal Medicine

## 2017-08-23 DIAGNOSIS — D509 Iron deficiency anemia, unspecified: Secondary | ICD-10-CM | POA: Diagnosis not present

## 2017-08-23 DIAGNOSIS — Z0000 Encounter for general adult medical examination without abnormal findings: Secondary | ICD-10-CM | POA: Diagnosis not present

## 2017-08-23 DIAGNOSIS — E118 Type 2 diabetes mellitus with unspecified complications: Secondary | ICD-10-CM | POA: Diagnosis not present

## 2017-08-23 DIAGNOSIS — I10 Essential (primary) hypertension: Secondary | ICD-10-CM | POA: Diagnosis not present

## 2017-08-23 DIAGNOSIS — E119 Type 2 diabetes mellitus without complications: Secondary | ICD-10-CM | POA: Diagnosis not present

## 2017-08-23 DIAGNOSIS — Z1231 Encounter for screening mammogram for malignant neoplasm of breast: Secondary | ICD-10-CM

## 2017-09-13 ENCOUNTER — Ambulatory Visit
Admission: RE | Admit: 2017-09-13 | Discharge: 2017-09-13 | Disposition: A | Discharge status: Home / Self Care | Payer: Medicare Other | Source: Ambulatory Visit | Attending: Internal Medicine | Admitting: Internal Medicine

## 2017-09-13 DIAGNOSIS — Z1231 Encounter for screening mammogram for malignant neoplasm of breast: Secondary | ICD-10-CM

## 2017-09-28 DIAGNOSIS — H4043X4 Glaucoma secondary to eye inflammation, bilateral, indeterminate stage: Secondary | ICD-10-CM | POA: Diagnosis not present

## 2017-10-31 DIAGNOSIS — H4043X1 Glaucoma secondary to eye inflammation, bilateral, mild stage: Secondary | ICD-10-CM | POA: Diagnosis not present

## 2017-11-07 DIAGNOSIS — H52223 Regular astigmatism, bilateral: Secondary | ICD-10-CM | POA: Diagnosis not present

## 2017-11-07 DIAGNOSIS — E119 Type 2 diabetes mellitus without complications: Secondary | ICD-10-CM | POA: Diagnosis not present

## 2017-11-14 DIAGNOSIS — H4043X4 Glaucoma secondary to eye inflammation, bilateral, indeterminate stage: Secondary | ICD-10-CM | POA: Diagnosis not present

## 2017-11-22 DIAGNOSIS — R768 Other specified abnormal immunological findings in serum: Secondary | ICD-10-CM | POA: Diagnosis not present

## 2017-11-22 DIAGNOSIS — H209 Unspecified iridocyclitis: Secondary | ICD-10-CM | POA: Diagnosis not present

## 2017-11-30 DIAGNOSIS — Z0000 Encounter for general adult medical examination without abnormal findings: Secondary | ICD-10-CM | POA: Diagnosis not present

## 2017-11-30 DIAGNOSIS — E119 Type 2 diabetes mellitus without complications: Secondary | ICD-10-CM | POA: Diagnosis not present

## 2017-11-30 DIAGNOSIS — D509 Iron deficiency anemia, unspecified: Secondary | ICD-10-CM | POA: Diagnosis not present

## 2017-12-05 DIAGNOSIS — H4043X4 Glaucoma secondary to eye inflammation, bilateral, indeterminate stage: Secondary | ICD-10-CM | POA: Diagnosis not present

## 2017-12-07 DIAGNOSIS — I10 Essential (primary) hypertension: Secondary | ICD-10-CM | POA: Diagnosis not present

## 2017-12-07 DIAGNOSIS — Z23 Encounter for immunization: Secondary | ICD-10-CM | POA: Diagnosis not present

## 2017-12-07 DIAGNOSIS — E119 Type 2 diabetes mellitus without complications: Secondary | ICD-10-CM | POA: Diagnosis not present

## 2017-12-07 DIAGNOSIS — D509 Iron deficiency anemia, unspecified: Secondary | ICD-10-CM | POA: Diagnosis not present

## 2018-02-05 DIAGNOSIS — H4043X4 Glaucoma secondary to eye inflammation, bilateral, indeterminate stage: Secondary | ICD-10-CM | POA: Diagnosis not present

## 2018-02-20 DIAGNOSIS — H4043X4 Glaucoma secondary to eye inflammation, bilateral, indeterminate stage: Secondary | ICD-10-CM | POA: Diagnosis not present

## 2018-04-02 DIAGNOSIS — E119 Type 2 diabetes mellitus without complications: Secondary | ICD-10-CM | POA: Diagnosis not present

## 2018-04-02 DIAGNOSIS — E7800 Pure hypercholesterolemia, unspecified: Secondary | ICD-10-CM | POA: Diagnosis not present

## 2018-04-02 DIAGNOSIS — D649 Anemia, unspecified: Secondary | ICD-10-CM | POA: Diagnosis not present

## 2018-04-02 DIAGNOSIS — I10 Essential (primary) hypertension: Secondary | ICD-10-CM | POA: Diagnosis not present

## 2018-04-03 DIAGNOSIS — Z96611 Presence of right artificial shoulder joint: Secondary | ICD-10-CM | POA: Diagnosis not present

## 2018-04-03 DIAGNOSIS — Z471 Aftercare following joint replacement surgery: Secondary | ICD-10-CM | POA: Diagnosis not present

## 2018-04-03 DIAGNOSIS — M25512 Pain in left shoulder: Secondary | ICD-10-CM | POA: Diagnosis not present

## 2018-04-09 DIAGNOSIS — E7800 Pure hypercholesterolemia, unspecified: Secondary | ICD-10-CM | POA: Diagnosis not present

## 2018-04-09 DIAGNOSIS — D649 Anemia, unspecified: Secondary | ICD-10-CM | POA: Diagnosis not present

## 2018-04-09 DIAGNOSIS — E118 Type 2 diabetes mellitus with unspecified complications: Secondary | ICD-10-CM | POA: Diagnosis not present

## 2018-04-09 DIAGNOSIS — I10 Essential (primary) hypertension: Secondary | ICD-10-CM | POA: Diagnosis not present

## 2018-04-10 DIAGNOSIS — H4043X4 Glaucoma secondary to eye inflammation, bilateral, indeterminate stage: Secondary | ICD-10-CM | POA: Diagnosis not present

## 2018-04-25 ENCOUNTER — Encounter (HOSPITAL_COMMUNITY): Payer: Self-pay

## 2018-04-25 ENCOUNTER — Emergency Department (HOSPITAL_COMMUNITY)
Admission: EM | Admit: 2018-04-25 | Discharge: 2018-04-25 | Disposition: A | Discharge status: Home / Self Care | Payer: Medicare Other | Attending: Emergency Medicine | Admitting: Emergency Medicine

## 2018-04-25 ENCOUNTER — Emergency Department (HOSPITAL_COMMUNITY): Payer: Medicare Other

## 2018-04-25 ENCOUNTER — Other Ambulatory Visit: Payer: Self-pay

## 2018-04-25 DIAGNOSIS — Y93K1 Activity, walking an animal: Secondary | ICD-10-CM | POA: Diagnosis not present

## 2018-04-25 DIAGNOSIS — S0081XA Abrasion of other part of head, initial encounter: Secondary | ICD-10-CM

## 2018-04-25 DIAGNOSIS — Y92480 Sidewalk as the place of occurrence of the external cause: Secondary | ICD-10-CM | POA: Insufficient documentation

## 2018-04-25 DIAGNOSIS — M7989 Other specified soft tissue disorders: Secondary | ICD-10-CM | POA: Diagnosis not present

## 2018-04-25 DIAGNOSIS — F329 Major depressive disorder, single episode, unspecified: Secondary | ICD-10-CM | POA: Insufficient documentation

## 2018-04-25 DIAGNOSIS — J45909 Unspecified asthma, uncomplicated: Secondary | ICD-10-CM | POA: Diagnosis not present

## 2018-04-25 DIAGNOSIS — M25562 Pain in left knee: Secondary | ICD-10-CM | POA: Diagnosis not present

## 2018-04-25 DIAGNOSIS — Z7982 Long term (current) use of aspirin: Secondary | ICD-10-CM | POA: Diagnosis not present

## 2018-04-25 DIAGNOSIS — Z96611 Presence of right artificial shoulder joint: Secondary | ICD-10-CM | POA: Insufficient documentation

## 2018-04-25 DIAGNOSIS — S60221A Contusion of right hand, initial encounter: Secondary | ICD-10-CM | POA: Insufficient documentation

## 2018-04-25 DIAGNOSIS — W010XXA Fall on same level from slipping, tripping and stumbling without subsequent striking against object, initial encounter: Secondary | ICD-10-CM | POA: Insufficient documentation

## 2018-04-25 DIAGNOSIS — M79641 Pain in right hand: Secondary | ICD-10-CM | POA: Diagnosis not present

## 2018-04-25 DIAGNOSIS — Z23 Encounter for immunization: Secondary | ICD-10-CM | POA: Diagnosis not present

## 2018-04-25 DIAGNOSIS — Y998 Other external cause status: Secondary | ICD-10-CM | POA: Insufficient documentation

## 2018-04-25 DIAGNOSIS — T07XXXA Unspecified multiple injuries, initial encounter: Secondary | ICD-10-CM

## 2018-04-25 DIAGNOSIS — S0993XA Unspecified injury of face, initial encounter: Secondary | ICD-10-CM | POA: Diagnosis present

## 2018-04-25 DIAGNOSIS — S8002XA Contusion of left knee, initial encounter: Secondary | ICD-10-CM | POA: Insufficient documentation

## 2018-04-25 DIAGNOSIS — Z79899 Other long term (current) drug therapy: Secondary | ICD-10-CM | POA: Diagnosis not present

## 2018-04-25 DIAGNOSIS — I10 Essential (primary) hypertension: Secondary | ICD-10-CM | POA: Insufficient documentation

## 2018-04-25 DIAGNOSIS — S0083XA Contusion of other part of head, initial encounter: Secondary | ICD-10-CM | POA: Diagnosis not present

## 2018-04-25 DIAGNOSIS — E119 Type 2 diabetes mellitus without complications: Secondary | ICD-10-CM | POA: Insufficient documentation

## 2018-04-25 DIAGNOSIS — Z7984 Long term (current) use of oral hypoglycemic drugs: Secondary | ICD-10-CM | POA: Insufficient documentation

## 2018-04-25 MED ORDER — TETANUS-DIPHTH-ACELL PERTUSSIS 5-2.5-18.5 LF-MCG/0.5 IM SUSP
0.5000 mL | Freq: Once | INTRAMUSCULAR | Status: AC
  Administered 2018-04-25: 0.5 mL via INTRAMUSCULAR
  Filled 2018-04-25: qty 0.5

## 2018-04-25 NOTE — ED Triage Notes (Addendum)
Patient states she was walking her dog and tripped on the sidewalk. Patient fell face first and hit concrete. Patient has had nasal bleeding, abrasions to the face, right hand injury, and left knee injury.

## 2018-04-25 NOTE — ED Notes (Signed)
Wound care performed, area cleansed with normal saline and gauze.

## 2018-04-25 NOTE — ED Notes (Signed)
Pt states that she tripped while walking the dog today. Pt report that she has pain to rt hand and the left knee pain. Pt has abrasion noted to nose and chin. Bleeding is controlled and ice applied to area pt friend is at bedside. Pt denies any loss of consciousness

## 2018-04-25 NOTE — ED Notes (Signed)
Bed: WA07 Expected date:  Expected time:  Means of arrival:  Comments: 

## 2018-04-25 NOTE — Discharge Instructions (Addendum)
Keep the wounds clean with soap and water daily.  Take Tylenol or Motrin for pain.  Return here if needed for problems.

## 2018-04-25 NOTE — ED Provider Notes (Signed)
[facility]-EMERGENCY DEPT Provider Note   CSN: [id number] Arrival date & time: 04/25/18  1107     History   Chief Complaint Chief Complaint  Patient presents with  . Fall  . Facial Injury  . Knee Injury  . Hand Injury    HPI Edward JollyMartha G Gartland is a 68 y.o. female.  HPI   Patient fell while walking her dog, because she tripped on an uneven spot on the sidewalk.  She was able to ambulate afterwards and decided come here for evaluation of multiple contusions and scrapes on her face.  She complains of pain in her right hand and left knee.  She cannot recall when she last had a tetanus booster.  She denies recent illnesses.  She denies loss of consciousness, fever, chills, nausea or vomiting.  There are no other known modifying factors.  Past Medical History:  Diagnosis Date  . Allergic rhinitis    takes Singulair at bedtime  . Anemia    takes Ferrous Sulfate daily  . Asthma    Albuterol and Flonase inhalers daily as needed  . Bruises easily   . Cataracts, bilateral   . Chronic back pain    DDD and stenosis.Slight scoliosis  . Depression    takes Zoloft daily  . DM (diabetes mellitus) (HCC)    takes Metformin daily  . GERD (gastroesophageal reflux disease)    takes Zantac nightly  . Glaucoma    uses eye drops nightly  . Glucose intolerance (impaired glucose tolerance)   . History of bronchitis 3581yrs ago  . Hyperlipidemia    takes Crestor daily  . Hypertension    takes Lozol and Verapamil daily  . Joint pain   . Muscle spasm    takes Robaxin daily as needed  . OCD (obsessive compulsive disorder)   . Osteoarthritis   . Osteoporosis   . Pneumonia 2+yrs ago   hx of  . Vitamin D deficiency     Patient Active Problem List   Diagnosis Date Noted  . S/P shoulder replacement 12/19/2014  . Esophageal reflux 02/26/2014  . Iron deficiency anemia 02/26/2014  . Postoperative aspiration pneumonitis 08/09/2012    Past Surgical History:  Procedure  Laterality Date  . COLONOSCOPY  08/08/12  . ELBOW SURGERY Right    plates and AOZH(08pins(22)  . ESOPHAGOGASTRODUODENOSCOPY    . TONGUE BIOPSY    . TONSILLECTOMY AND ADENOIDECTOMY    . TOTAL SHOULDER ARTHROPLASTY Right 12/19/2014   Procedure: TOTAL SHOULDER ARTHROPLASTY;  Surgeon: Beverely LowSteve Norris, MD;  [facility];  Service: Orthopedics;  Laterality: Right;  . WISDOM TOOTH EXTRACTION       OB History   No obstetric history on file.      Home Medications    Prior to Admission medications   Medication Sig Start Date End Date Taking? Authorizing Provider  acetaminophen (TYLENOL) 650 MG CR tablet Take 1,300 mg by mouth every 8 (eight) hours as needed for pain.    [provider]  albuterol (PROVENTIL HFA;VENTOLIN HFA) 108 (90 BASE) MCG/ACT inhaler Inhale 2 puffs into the lungs daily.    [provider]  aspirin 81 MG tablet Take 81 mg by mouth daily.    [provider]  beta carotene [id number] UNIT capsule Take 25,000 Units by mouth daily.    [provider]  calcium carbonate (TUMS - DOSED IN MG ELEMENTAL CALCIUM) 500 MG chewable tablet Chew 1 tablet by mouth daily as needed for indigestion or heartburn.  [provider]  Calcium-Magnesium-Vitamin D (CALCIUM 500 PO) Take 1 tablet by mouth 2 (two) times daily.    [provider]  cholecalciferol (VITAMIN D) 1000 UNITS tablet Take 1,000 Units by mouth daily.    [provider]  Cinnamon 500 MG capsule Take 500 mg by mouth 2 (two) times daily.     [provider]  Coenzyme Q10 (CO Q 10) 100 MG CAPS Take 1 capsule by mouth daily.    [provider]  diphenhydrAMINE (BENADRYL) 25 mg capsule Take 25 mg by mouth every 6 (six) hours as needed for allergies.    [provider]  ferrous sulfate 325 (65 FE) MG tablet Take 325 mg by mouth daily with breakfast.    [provider]  Fluticasone-Salmeterol (ADVAIR) 100-50 MCG/DOSE AEPB Inhale 1 puff into the lungs  2 (two) times daily.    [provider]  HYDROcodone-acetaminophen (NORCO) 5-325 MG tablet Take 1-2 tablets by mouth every 6 (six) hours as needed for moderate pain. 12/19/14   Beverely Low, MD  indapamide (LOZOL) 2.5 MG tablet Take 2.5 mg by mouth daily.    [provider]  latanoprost (XALATAN) 0.005 % ophthalmic solution Place 1 drop into both eyes at bedtime. 11/13/14   [provider]  Magnesium 250 MG TABS Take 1 tablet by mouth daily.    [provider]  metFORMIN (GLUCOPHAGE) 500 MG tablet Take 250-500 mg by mouth daily. With evening meal    [provider]  methocarbamol (ROBAXIN) 500 MG tablet Take 500 mg by mouth every 6 (six) hours as needed for muscle spasms.     [provider]  methocarbamol (ROBAXIN) 500 MG tablet Take 1 tablet (500 mg total) by mouth 3 (three) times daily as needed. 12/19/14   Beverely Low, MD  montelukast (SINGULAIR) 10 MG tablet Take 10 mg by mouth at bedtime.    [provider]  Multiple Vitamins-Minerals (MULTIVITAMIN WITH MINERALS) tablet Take 1 tablet by mouth daily.    [provider]  Omega-3 Fatty Acids (FISH OIL) 500 MG CAPS Take 1 capsule by mouth 2 (two) times daily.    [provider]  ranitidine (ZANTAC) 150 MG capsule Take 150 mg by mouth every evening.     [provider]  rosuvastatin (CRESTOR) 40 MG tablet Take 20 mg by mouth at bedtime. Take 1/2 tablet once daily.    [provider]  sertraline (ZOLOFT) 100 MG tablet Take 100 mg by mouth daily.    [provider]  SIMBRINZA 1-0.2 % SUSP Place 1 drop into both eyes 2 (two) times daily. 11/29/14   [provider]  verapamil (VERELAN PM) 360 MG 24 hr capsule Take 360 mg by mouth every morning.    [provider]  vitamin C (ASCORBIC ACID) 500 MG tablet Take 500 mg by mouth daily.    [provider]    Family History Family History  Problem Relation Age of Onset  .  Melanoma Mother   . Hiatal hernia Mother        with part of stomach removed  . Clotting disorder Mother   . Anemia Mother   . Heart attack Father   . CVA Father   . Diabetes type II Father   . Ulcerative colitis Father   . Heart disease Paternal Grandmother   . Stroke Maternal Grandmother   . AAA (abdominal aortic aneurysm) Maternal Grandmother   . Breast cancer Maternal Aunt  and great aunt  . Hemophilia Maternal Uncle   . Crohn's disease Maternal Aunt     Social History Social History   Tobacco Use  . Smoking status: Never Smoker  . Smokeless tobacco: Never Used  Substance Use Topics  . Alcohol use: No  . Drug use: No     Allergies   Sulfa antibiotics   Review of Systems Review of Systems  All other systems reviewed and are negative.    Physical Exam Updated Vital Signs BP (!) 153/88   Pulse 75   Resp 18   Ht 5' (1.524 m)   Wt 72.6 kg   SpO2 96%   BMI 31.25 kg/m   Physical Exam Vitals signs and nursing note reviewed.  Constitutional:      General: She is not in acute distress.    Appearance: Normal appearance. She is well-developed. She is obese. She is not ill-appearing or diaphoretic.  HENT:     Head: Normocephalic.     Comments: Superficial abrasions, face, nose and cheeks.  No midface instability.  No nasal deformity.  Mild nasal bone tenderness bilaterally.    Right Ear: External ear normal.     Left Ear: External ear normal.     Nose: No congestion or rhinorrhea.     Mouth/Throat:     Pharynx: No oropharyngeal exudate or posterior oropharyngeal erythema.  Eyes:     Conjunctiva/sclera: Conjunctivae normal.     Pupils: Pupils are equal, round, and reactive to light.  Neck:     Musculoskeletal: Normal range of motion and neck supple. No neck rigidity or muscular tenderness.     Trachea: Phonation normal.  Cardiovascular:     Rate and Rhythm: Normal rate and regular rhythm.     Heart sounds: Normal heart sounds.  Pulmonary:      Effort: Pulmonary effort is normal.     Breath sounds: Normal breath sounds.  Abdominal:     Palpations: Abdomen is soft.     Tenderness: There is no abdominal tenderness.  Musculoskeletal:     Comments: Mild tenderness left knee but normal range of motion.  Mild tenderness right hand, with normal range of motion.  Skin:    General: Skin is warm and dry.  Neurological:     Mental Status: She is alert and oriented to person, place, and time.     Cranial Nerves: No cranial nerve deficit.     Sensory: No sensory deficit.     Motor: No abnormal muscle tone.     Coordination: Coordination normal.  Psychiatric:        Mood and Affect: Mood normal.        Behavior: Behavior normal.        Thought Content: Thought content normal.        Judgment: Judgment normal.      ED Treatments / Results  Labs (all labs ordered are listed, but only abnormal results are displayed) Labs Reviewed - No data to display  EKG None  Radiology Dg Knee Complete 4 Views Left  Result Date: 04/25/2018 CLINICAL DATA:  Tripped walking dog.  Pain. EXAM: LEFT KNEE - COMPLETE 4+ VIEW COMPARISON:  None. FINDINGS: No acute fracture deformity or dislocation. Cortical thickening of proximal fibular could reflect old injury. No destructive bony lesions. No advanced degenerative change. Prepatellar soft tissue swelling with phleboliths. IMPRESSION: 1. Prepatellar soft tissue swelling without acute osseous process. Electronically Signed   By: Awilda Metroourtnay  Bloomer M.D.   On: 04/25/2018 12:44  Dg Hand Complete Right  Result Date: 04/25/2018 CLINICAL DATA:  Right hand pain. EXAM: RIGHT HAND - COMPLETE 3+ VIEW COMPARISON:  None. FINDINGS: There is no evidence of fracture or dislocation. Moderate degenerative change of first carpometacarpal joint is noted. Soft tissues are unremarkable. IMPRESSION: Moderate osteoarthritis of first carpometacarpal joint. No acute abnormality seen in the right hand. Electronically Signed   By:  Lupita Raider, M.D.   On: 04/25/2018 12:46    Procedures Procedures (including critical care time)  Medications Ordered in ED Medications  Tdap (BOOSTRIX) injection 0.5 mL (0.5 mLs Intramuscular Given 04/25/18 1318)     Initial Impression / Assessment and Plan / ED Course  I have reviewed the triage vital signs and the nursing notes.  Pertinent labs & imaging results that were available during my care of the patient were reviewed by me and considered in my medical decision making (see chart for details).      Patient Vitals for the past 24 hrs:  BP Temp Pulse Resp SpO2 Height Weight  04/25/18 1328 (!) 153/88 - 75 - 96 % - -  04/25/18 1122 - - - - - 5' (1.524 m) 72.6 kg  04/25/18 1116 (!) 197/110 - 83 18 97 % - -    1:31 PM Reevaluation with update and discussion. After initial assessment and treatment, an updated evaluation reveals she is more comfortable at this time and has no further complaints.  Findings discussed and questions answered. Mancel Bale   Medical Decision Making: Fall likely mechanical, without serious injury.  Imaging is reassuring.  CRITICAL CARE-no Performed by: Mancel Bale  Nursing Notes Reviewed/ Care Coordinated Applicable Imaging Reviewed Interpretation of Laboratory Data incorporated into ED treatment  The patient appears reasonably screened and/or stabilized for discharge and I doubt any other medical condition or other [facility] requiring further screening, evaluation, or treatment in the ED at this time prior to discharge.  Plan: Home Medications-OTC analgesia, continue current medications; Home Treatments-wound care at home; return here if the recommended treatment, does not improve the symptoms; Recommended follow up-PCP, PRN   Final Clinical Impressions(s) / ED Diagnoses   Final diagnoses:  Contusion of face, initial encounter  Abrasion of face, initial encounter  Contusion, multiple sites    ED Discharge Orders    None         Mancel Bale, MD 04/25/18 1331

## 2018-05-15 DIAGNOSIS — H4043X4 Glaucoma secondary to eye inflammation, bilateral, indeterminate stage: Secondary | ICD-10-CM | POA: Diagnosis not present

## 2018-05-16 DIAGNOSIS — M25512 Pain in left shoulder: Secondary | ICD-10-CM | POA: Diagnosis not present

## 2018-05-29 DIAGNOSIS — H4043X4 Glaucoma secondary to eye inflammation, bilateral, indeterminate stage: Secondary | ICD-10-CM | POA: Diagnosis not present

## 2018-07-30 DIAGNOSIS — H4043X1 Glaucoma secondary to eye inflammation, bilateral, mild stage: Secondary | ICD-10-CM | POA: Diagnosis not present

## 2018-08-28 DIAGNOSIS — I10 Essential (primary) hypertension: Secondary | ICD-10-CM | POA: Diagnosis not present

## 2018-08-28 DIAGNOSIS — D509 Iron deficiency anemia, unspecified: Secondary | ICD-10-CM | POA: Diagnosis not present

## 2018-08-28 DIAGNOSIS — N390 Urinary tract infection, site not specified: Secondary | ICD-10-CM | POA: Diagnosis not present

## 2018-08-28 DIAGNOSIS — R739 Hyperglycemia, unspecified: Secondary | ICD-10-CM | POA: Diagnosis not present

## 2018-08-28 DIAGNOSIS — E785 Hyperlipidemia, unspecified: Secondary | ICD-10-CM | POA: Diagnosis not present

## 2018-09-06 DIAGNOSIS — Z0000 Encounter for general adult medical examination without abnormal findings: Secondary | ICD-10-CM | POA: Diagnosis not present

## 2018-09-06 DIAGNOSIS — E118 Type 2 diabetes mellitus with unspecified complications: Secondary | ICD-10-CM | POA: Diagnosis not present

## 2018-09-06 DIAGNOSIS — I10 Essential (primary) hypertension: Secondary | ICD-10-CM | POA: Diagnosis not present

## 2018-09-06 DIAGNOSIS — D509 Iron deficiency anemia, unspecified: Secondary | ICD-10-CM | POA: Diagnosis not present

## 2018-09-06 DIAGNOSIS — R740 Nonspecific elevation of levels of transaminase and lactic acid dehydrogenase [LDH]: Secondary | ICD-10-CM | POA: Diagnosis not present

## 2018-10-30 DIAGNOSIS — H4043X1 Glaucoma secondary to eye inflammation, bilateral, mild stage: Secondary | ICD-10-CM | POA: Diagnosis not present

## 2018-12-25 DIAGNOSIS — M25512 Pain in left shoulder: Secondary | ICD-10-CM | POA: Diagnosis not present

## 2019-02-05 DIAGNOSIS — H4043X1 Glaucoma secondary to eye inflammation, bilateral, mild stage: Secondary | ICD-10-CM | POA: Diagnosis not present

## 2019-03-04 DIAGNOSIS — R739 Hyperglycemia, unspecified: Secondary | ICD-10-CM | POA: Diagnosis not present

## 2019-03-04 DIAGNOSIS — N390 Urinary tract infection, site not specified: Secondary | ICD-10-CM | POA: Diagnosis not present

## 2019-03-04 DIAGNOSIS — I10 Essential (primary) hypertension: Secondary | ICD-10-CM | POA: Diagnosis not present

## 2019-03-04 DIAGNOSIS — E7800 Pure hypercholesterolemia, unspecified: Secondary | ICD-10-CM | POA: Diagnosis not present

## 2019-03-11 DIAGNOSIS — I10 Essential (primary) hypertension: Secondary | ICD-10-CM | POA: Diagnosis not present

## 2019-03-11 DIAGNOSIS — E785 Hyperlipidemia, unspecified: Secondary | ICD-10-CM | POA: Diagnosis not present

## 2019-03-11 DIAGNOSIS — E119 Type 2 diabetes mellitus without complications: Secondary | ICD-10-CM | POA: Diagnosis not present

## 2019-06-25 DIAGNOSIS — H5211 Myopia, right eye: Secondary | ICD-10-CM | POA: Diagnosis not present

## 2019-06-25 DIAGNOSIS — E119 Type 2 diabetes mellitus without complications: Secondary | ICD-10-CM | POA: Diagnosis not present

## 2019-07-03 DIAGNOSIS — E119 Type 2 diabetes mellitus without complications: Secondary | ICD-10-CM | POA: Diagnosis not present

## 2019-07-03 DIAGNOSIS — I10 Essential (primary) hypertension: Secondary | ICD-10-CM | POA: Diagnosis not present

## 2019-07-08 ENCOUNTER — Other Ambulatory Visit: Payer: Self-pay | Admitting: Internal Medicine

## 2019-07-08 DIAGNOSIS — Z1231 Encounter for screening mammogram for malignant neoplasm of breast: Secondary | ICD-10-CM

## 2019-07-10 DIAGNOSIS — E118 Type 2 diabetes mellitus with unspecified complications: Secondary | ICD-10-CM | POA: Diagnosis not present

## 2019-07-10 DIAGNOSIS — M810 Age-related osteoporosis without current pathological fracture: Secondary | ICD-10-CM | POA: Diagnosis not present

## 2019-07-10 DIAGNOSIS — D649 Anemia, unspecified: Secondary | ICD-10-CM | POA: Diagnosis not present

## 2019-07-10 DIAGNOSIS — J45998 Other asthma: Secondary | ICD-10-CM | POA: Diagnosis not present

## 2019-07-10 DIAGNOSIS — I10 Essential (primary) hypertension: Secondary | ICD-10-CM | POA: Diagnosis not present

## 2019-07-15 ENCOUNTER — Other Ambulatory Visit: Payer: Self-pay

## 2019-07-15 ENCOUNTER — Ambulatory Visit
Admission: RE | Admit: 2019-07-15 | Discharge: 2019-07-15 | Disposition: A | Discharge status: Home / Self Care | Payer: Medicare Other | Source: Ambulatory Visit | Attending: Internal Medicine | Admitting: Internal Medicine

## 2019-07-15 DIAGNOSIS — Z1231 Encounter for screening mammogram for malignant neoplasm of breast: Secondary | ICD-10-CM

## 2019-08-06 DIAGNOSIS — H4043X1 Glaucoma secondary to eye inflammation, bilateral, mild stage: Secondary | ICD-10-CM | POA: Diagnosis not present

## 2019-08-14 DIAGNOSIS — M25512 Pain in left shoulder: Secondary | ICD-10-CM | POA: Diagnosis not present

## 2019-09-20 IMAGING — MG DIGITAL SCREENING BILATERAL MAMMOGRAM WITH TOMO AND CAD
8 series · 8 of 24 positions shown · non-contrast
Comparison: Previous exam(s).

CLINICAL DATA: Screening.

EXAM:
DIGITAL SCREENING BILATERAL MAMMOGRAM WITH TOMO AND CAD

[R CC synth-2D]
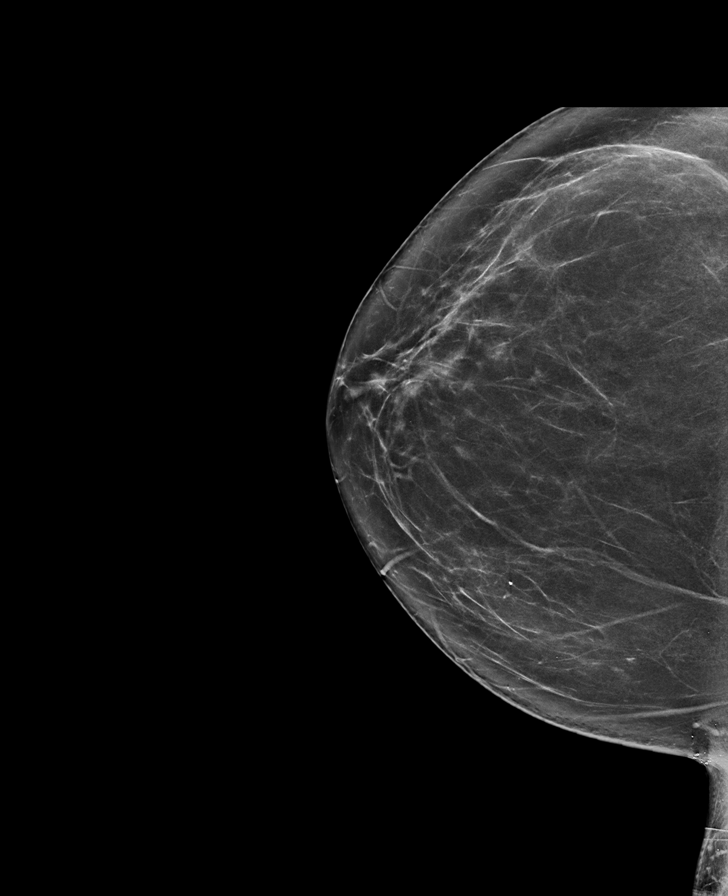

[L CC synth-2D]
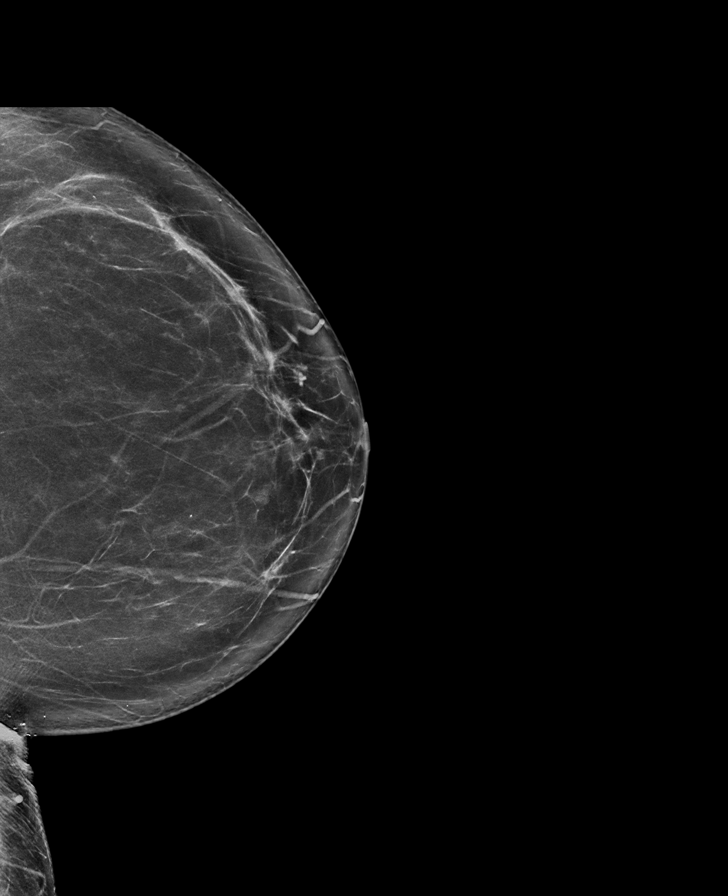

[R MLO synth-2D]
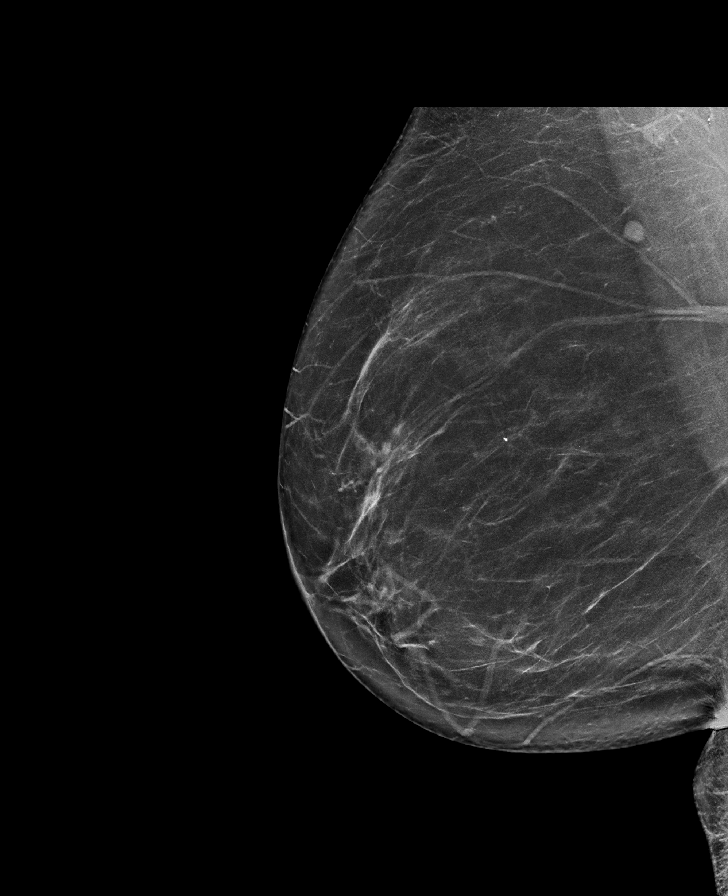

[L MLO synth-2D]
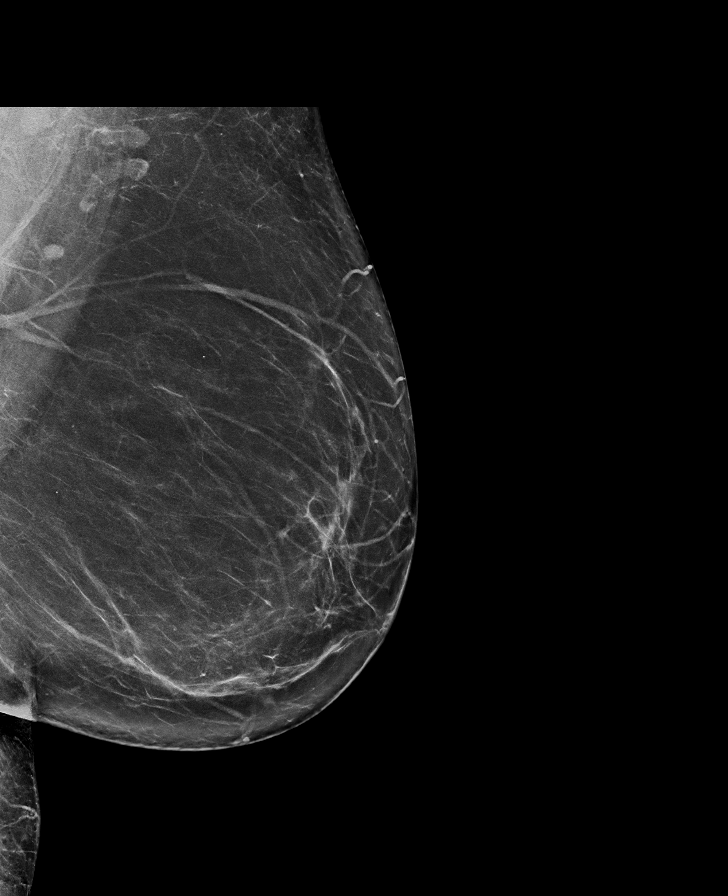

[R MLO tomo · tomo slice 41/81.0]
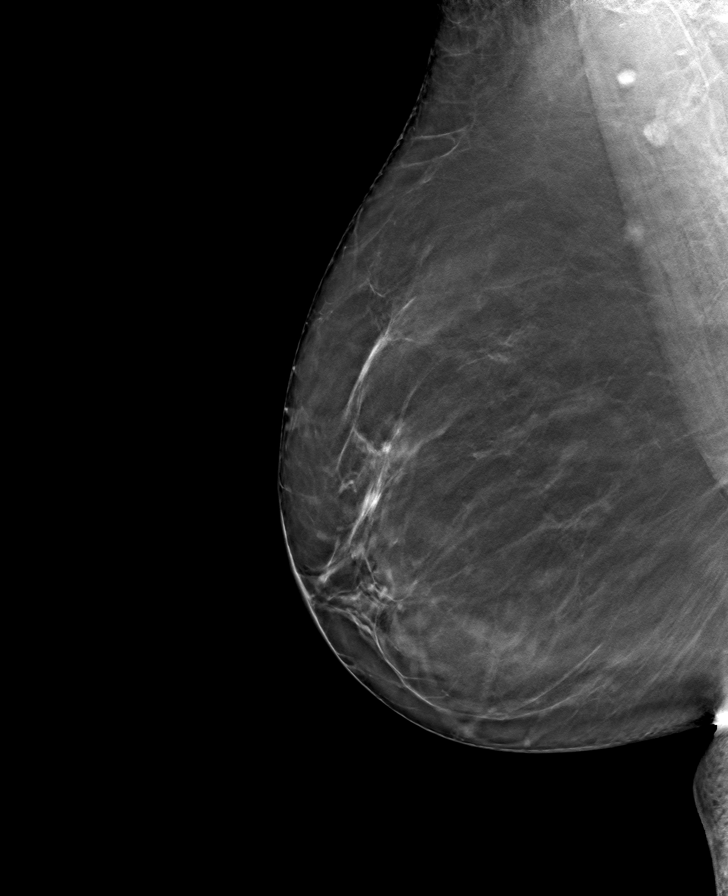

[L CC tomo · tomo slice 43/84.0]
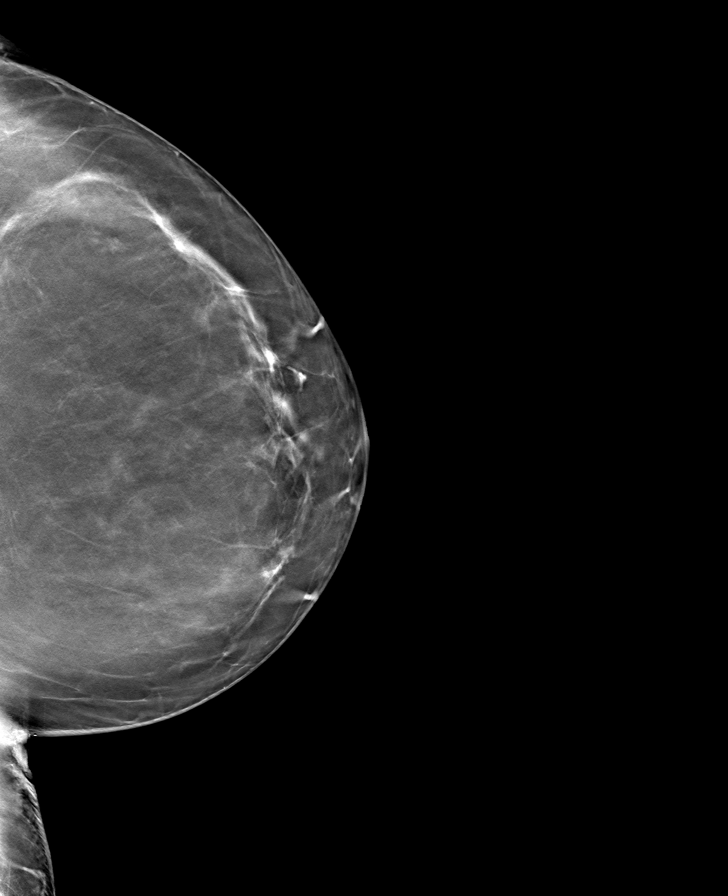

[L MLO tomo · tomo slice 41/82.0]
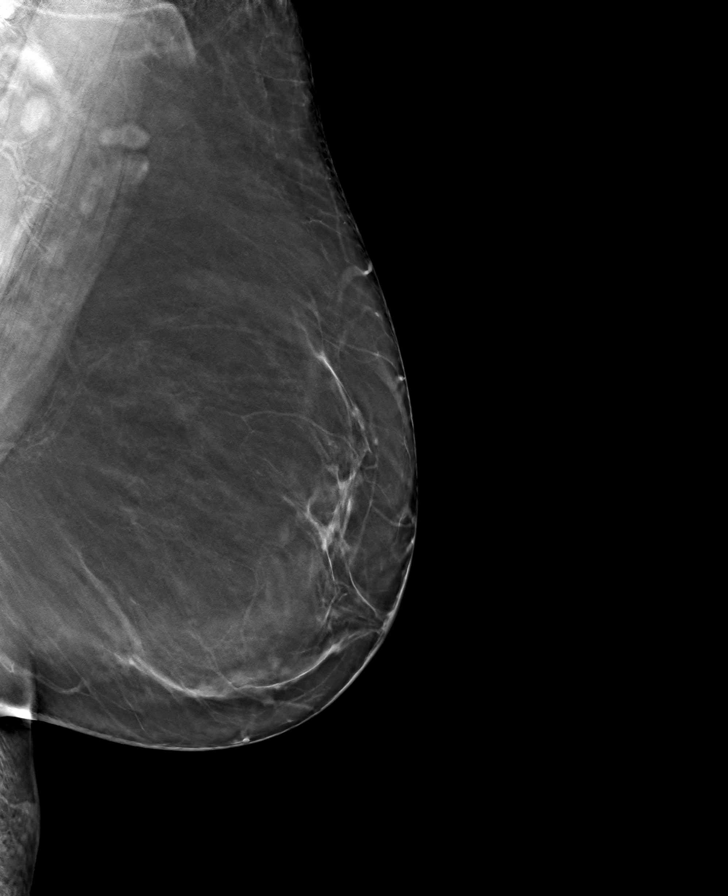

[R CC tomo · tomo slice 40/79.0]
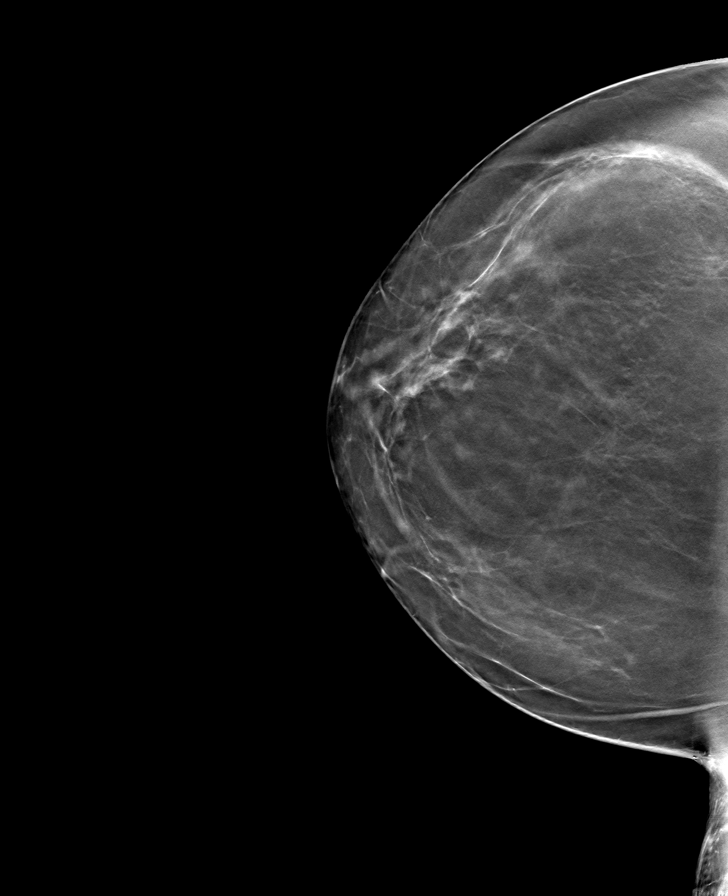

[8 of 24 positions shown; findings below may reference images not displayed]

ACR Breast Density Category b: There are scattered areas of
fibroglandular density.
FINDINGS: There are no findings suspicious for malignancy. Images were
processed with CAD.
IMPRESSION: No mammographic evidence of malignancy. A result letter of this
screening mammogram will be mailed directly to the patient.

RECOMMENDATION:
Screening mammogram in one year. (Code:CN-U-775)

BI-RADS CATEGORY  1: Negative.

## 2019-10-03 DIAGNOSIS — I10 Essential (primary) hypertension: Secondary | ICD-10-CM | POA: Diagnosis not present

## 2019-10-03 DIAGNOSIS — E119 Type 2 diabetes mellitus without complications: Secondary | ICD-10-CM | POA: Diagnosis not present

## 2019-10-03 DIAGNOSIS — E559 Vitamin D deficiency, unspecified: Secondary | ICD-10-CM | POA: Diagnosis not present

## 2019-10-03 DIAGNOSIS — E7800 Pure hypercholesterolemia, unspecified: Secondary | ICD-10-CM | POA: Diagnosis not present

## 2019-10-30 DIAGNOSIS — I10 Essential (primary) hypertension: Secondary | ICD-10-CM | POA: Diagnosis not present

## 2019-10-30 DIAGNOSIS — E7800 Pure hypercholesterolemia, unspecified: Secondary | ICD-10-CM | POA: Diagnosis not present

## 2019-10-30 DIAGNOSIS — Z780 Asymptomatic menopausal state: Secondary | ICD-10-CM | POA: Diagnosis not present

## 2019-10-30 DIAGNOSIS — E119 Type 2 diabetes mellitus without complications: Secondary | ICD-10-CM | POA: Diagnosis not present

## 2019-10-30 DIAGNOSIS — Z0000 Encounter for general adult medical examination without abnormal findings: Secondary | ICD-10-CM | POA: Diagnosis not present

## 2019-10-30 DIAGNOSIS — D509 Iron deficiency anemia, unspecified: Secondary | ICD-10-CM | POA: Diagnosis not present

## 2019-11-13 DIAGNOSIS — M25512 Pain in left shoulder: Secondary | ICD-10-CM | POA: Diagnosis not present

## 2019-12-28 ENCOUNTER — Ambulatory Visit: Payer: Medicare Other | Attending: Internal Medicine

## 2019-12-28 ENCOUNTER — Other Ambulatory Visit: Payer: Self-pay

## 2019-12-28 DIAGNOSIS — Z23 Encounter for immunization: Secondary | ICD-10-CM

## 2019-12-28 NOTE — Progress Notes (Signed)
   Covid-19 Vaccination Clinic  Name:  Amanda Odonnell    MRN: [id number] DOB: 1950-08-02  12/28/2019  [name] was observed post Covid-19 immunization for 15 minutes without incident. She was provided with Vaccine Information Sheet and instruction to access the V-Safe system.   [name] was instructed to call [phone] with any severe reactions post vaccine: Marland Kitchen Difficulty breathing  . Swelling of face and throat  . A fast heartbeat  . A bad rash all over body  . Dizziness and weakness

## 2019-12-30 DIAGNOSIS — H4043X4 Glaucoma secondary to eye inflammation, bilateral, indeterminate stage: Secondary | ICD-10-CM | POA: Diagnosis not present

## 2019-12-30 DIAGNOSIS — H04123 Dry eye syndrome of bilateral lacrimal glands: Secondary | ICD-10-CM | POA: Diagnosis not present

## 2020-01-21 NOTE — Progress Notes (Signed)
DUE TO COVID-19 ONLY ONE VISITOR IS ALLOWED TO COME WITH YOU AND STAY IN THE WAITING ROOM ONLY DURING PRE OP AND PROCEDURE DAY OF SURGERY. THE 1 VISITOR  MAY VISIT WITH YOU AFTER SURGERY IN YOUR PRIVATE ROOM DURING VISITING HOURS ONLY!  YOU NEED TO HAVE A COVID 19 TEST ON___11/16/2021____ @_______ , THIS TEST MUST BE DONE BEFORE SURGERY,  COVID TESTING SITE [address] [place] , IT IS ON THE RIGHT GOING OUT [address] APPROXIMATELY  2 MINUTES PAST [employer] ON THE RIGHT. ONCE YOUR COVID TEST IS COMPLETED,  PLEASE BEGIN THE QUARANTINE INSTRUCTIONS AS OUTLINED IN YOUR HANDOUT.                JULIONA VALES  01/21/2020   Your procedure is scheduled on: 01/31/2020    Report to [facility] Main  Entrance   Report to admitting at    1015 AM     Call this number if you have problems the morning of surgery [phone]    REMEMBER: NO  SOLID FOOD CANDY OR GUM AFTER MIDNIGHT. CLEAR LIQUIDS UNTIL 0945am        . NOTHING BY MOUTH EXCEPT CLEAR LIQUIDS UNTIL    . PLEASE FINISH ENSURE/G2 DRINK PER SURGEON ORDER  WHICH NEEDS TO BE COMPLETED AT 0945am     .      CLEAR LIQUID DIET   Foods Allowed                                                                    Coffee and tea, regular and decaf                            Fruit ices (not with fruit pulp)                                      Iced Popsicles                                    Carbonated beverages, regular and diet                                    Cranberry, grape and apple juices Sports drinks like Gatorade Lightly seasoned clear broth or consume(fat free) Sugar, honey syrup ___________________________________________________________________      BRUSH YOUR TEETH MORNING OF SURGERY AND RINSE YOUR MOUTH OUT, NO CHEWING GUM CANDY OR MINTS.     Take these medicines the morning of surgery with A SIP OF WATER: Verapamil, Inhalers as usual and bring, Eye drops as usual , Claritin,  zoloft   DO NOT TAKE ANY DIABETIC MEDICATIONS DAY OF YOUR SURGERY                               You may not have any metal on your body including hair pins and  piercings  Do not wear jewelry, make-up, lotions, powders or perfumes, deodorant             Do not wear nail polish on your fingernails.  Do not shave  48 hours prior to surgery.              Men may shave face and neck.   Do not bring valuables to the hospital. [place].  Contacts, dentures or bridgework may not be worn into surgery.  Leave suitcase in the car. After surgery it may be brought to your room.     Patients discharged the day of surgery will not be allowed to drive home. IF YOU ARE HAVING SURGERY AND GOING HOME THE SAME DAY, YOU MUST HAVE AN ADULT TO DRIVE YOU HOME AND BE WITH YOU FOR 24 HOURS. YOU MAY GO HOME BY TAXI OR UBER OR ORTHERWISE, BUT AN ADULT MUST ACCOMPANY YOU HOME AND STAY WITH YOU FOR 24 HOURS.  Name and phone number of your driver:  Special Instructions: N/A              Please read over the following fact sheets you were given: _____________________________________________________________________  [facility] - Preparing for Surgery Before surgery, you can play an important role.  Because skin is not sterile, your skin needs to be as free of germs as possible.  You can reduce the number of germs on your skin by washing with CHG (chlorahexidine gluconate) soap before surgery.  CHG is an antiseptic cleaner which kills germs and bonds with the skin to continue killing germs even after washing. Please DO NOT use if you have an allergy to CHG or antibacterial soaps.  If your skin becomes reddened/irritated stop using the CHG and inform your nurse when you arrive at Short Stay. Do not shave (including legs and underarms) for at least 48 hours prior to the first CHG shower.  You may shave your face/neck. Please follow these instructions  carefully:  1.  Shower with CHG Soap the night before surgery and the  morning of Surgery.  2.  If you choose to wash your hair, wash your hair first as usual with your  normal  shampoo.  3.  After you shampoo, rinse your hair and body thoroughly to remove the  shampoo.                           4.  Use CHG as you would any other liquid soap.  You can apply chg directly  to the skin and wash                       Gently with a scrungie or clean washcloth.  5.  Apply the CHG Soap to your body ONLY FROM THE NECK DOWN.   Do not use on face/ open                           Wound or open sores. Avoid contact with eyes, ears mouth and genitals (private parts).                       Wash face,  Genitals (private parts) with your normal soap.             6.  Wash thoroughly, paying special attention to the area where your surgery  will be performed.  7.  Thoroughly rinse your body with warm water from the neck down.  8.  DO NOT shower/wash with your normal soap after using and rinsing off  the CHG Soap.                9.  Pat yourself dry with a clean towel.            10.  Wear clean pajamas.            11.  Place clean sheets on your bed the night of your first shower and do not  sleep with pets. Day of Surgery : Do not apply any lotions/deodorants the morning of surgery.  Please wear clean clothes to the hospital/surgery center.  FAILURE TO FOLLOW THESE INSTRUCTIONS MAY RESULT IN THE CANCELLATION OF YOUR SURGERY PATIENT SIGNATURE_________________________________  NURSE SIGNATURE__________________________________  ________________________________________________________________________   [facility]- Preparing for Total Shoulder Arthroplasty    Before surgery, you can play an important role. Because skin is not sterile, your skin needs to be as free of germs as possible. You can reduce the number of germs on your skin by using the following products. . Benzoyl Peroxide Gel o Reduces the number  of germs present on the skin o Applied twice a day to shoulder area starting two days before surgery    ==================================================================  Please follow these instructions carefully:  BENZOYL PEROXIDE 5% GEL  Please do not use if you have an allergy to benzoyl peroxide.   If your skin becomes reddened/irritated stop using the benzoyl peroxide.  Starting two days before surgery, apply as follows: 1. Apply benzoyl peroxide in the morning and at night. Apply after taking a shower. If you are not taking a shower clean entire shoulder front, back, and side along with the armpit with a clean wet washcloth.  2. Place a quarter-sized dollop on your shoulder and rub in thoroughly, making sure to cover the front, back, and side of your shoulder, along with the armpit.   2 days before ____ AM   ____ PM              1 day before ____ AM   ____ PM                         3. Do this twice a day for two days.  (Last application is the night before surgery, AFTER using the CHG soap as described below).  4. Do NOT apply benzoyl peroxide gel on the day of surgery.

## 2020-01-21 NOTE — H&P (Signed)
Patient's anticipated LOS is less than 2 midnights, meeting these requirements: - Younger than 29 - Lives within 1 hour of care - Has a competent adult at home to recover with post-op recover - NO history of  - Chronic pain requiring opiods  - Diabetes  - Coronary Artery Disease  - Heart failure  - Heart attack  - Stroke  - DVT/VTE  - Cardiac arrhythmia  - Respiratory Failure/COPD  - Renal failure  - Anemia  - Advanced Liver disease       Amanda Odonnell is an 69 y.o. female.    Chief Complaint: left shoulder pain  HPI: Pt is a 69 y.o. female complaining of left shoulder pain for multiple years. Pain had continually increased since the beginning. X-rays in the clinic show end-stage arthritic changes of the left shoulder. Pt has tried various conservative treatments which have failed to alleviate their symptoms, including injections and therapy. Various options are discussed with the patient. Risks, benefits and expectations were discussed with the patient. Patient understand the risks, benefits and expectations and wishes to proceed with surgery.   PCP:  Pearson Grippe, MD  D/C Plans: Home  PMH: Past Medical History:  Diagnosis Date  . Allergic rhinitis    takes Singulair at bedtime  . Anemia    takes Ferrous Sulfate daily  . Asthma    Albuterol and Flonase inhalers daily as needed  . Bruises easily   . Cataracts, bilateral   . Chronic back pain    DDD and stenosis.Slight scoliosis  . Depression    takes Zoloft daily  . DM (diabetes mellitus) (HCC)    takes Metformin daily  . GERD (gastroesophageal reflux disease)    takes Zantac nightly  . Glaucoma    uses eye drops nightly  . Glucose intolerance (impaired glucose tolerance)   . History of bronchitis 40yrs ago  . Hyperlipidemia    takes Crestor daily  . Hypertension    takes Lozol and Verapamil daily  . Joint pain   . Muscle spasm    takes Robaxin daily as needed  . OCD (obsessive compulsive disorder)     . Osteoarthritis   . Osteoporosis   . Pneumonia 2+yrs ago   hx of  . Vitamin D deficiency     PSH: Past Surgical History:  Procedure Laterality Date  . COLONOSCOPY  08/08/12  . ELBOW SURGERY Right    plates and IWPY(09)  . ESOPHAGOGASTRODUODENOSCOPY    . TONGUE BIOPSY    . TONSILLECTOMY AND ADENOIDECTOMY    . TOTAL SHOULDER ARTHROPLASTY Right 12/19/2014   Procedure: TOTAL SHOULDER ARTHROPLASTY;  Surgeon: Beverely Low, MD;  Location: [facility] OR;  Service: Orthopedics;  Laterality: Right;  . WISDOM TOOTH EXTRACTION      Social History:  reports that she has never smoked. She has never used smokeless tobacco. She reports that she does not drink alcohol and does not use drugs.  Allergies:  Allergies  Allergen Reactions  . Sulfa Antibiotics Hives    Medications: No current facility-administered medications for this encounter.   Current Outpatient Medications  Medication Sig Dispense Refill  . albuterol (PROVENTIL HFA;VENTOLIN HFA) 108 (90 BASE) MCG/ACT inhaler Inhale 2 puffs into the lungs every 6 (six) hours as needed for wheezing or shortness of breath.     Marland Kitchen CALCIUM-VITAMIN D PO Take 2 tablets by mouth daily.    . cholecalciferol (VITAMIN D) 1000 UNITS tablet Take 1,000 Units by mouth daily.    Marland Kitchen CINNAMON PO  Take 1,000 mg by mouth 2 (two) times daily.     . Coenzyme Q10 (CO Q 10) 100 MG CAPS Take 100 mg by mouth daily.     . cyclobenzaprine (FLEXERIL) 5 MG tablet Take 5 mg by mouth 3 (three) times daily as needed for muscle spasms.    . dorzolamide-timolol (COSOPT) 22.3-6.8 MG/ML ophthalmic solution Place 1 drop into both eyes 2 (two) times daily.    . Ginger, Zingiber officinalis, (GINGER ROOT) 550 MG CAPS Take 1,100 mg by mouth in the morning and at bedtime.    . indapamide (LOZOL) 2.5 MG tablet Take 2.5 mg by mouth daily.    Marland Kitchen loratadine (CLARITIN) 10 MG tablet Take 10 mg by mouth daily.    . metFORMIN (GLUCOPHAGE) 500 MG tablet Take 500 mg by mouth daily.     . montelukast  (SINGULAIR) 10 MG tablet Take 10 mg by mouth at bedtime.    . Multiple Vitamins-Minerals (MULTIVITAMIN WITH MINERALS) tablet Take 1 tablet by mouth daily.    . Omega-3 Fatty Acids (FISH OIL) 1000 MG CAPS Take 1,000 mg by mouth 2 (two) times daily.     . prednisoLONE acetate (PRED FORTE) 1 % ophthalmic suspension Place 1 drop into both eyes in the morning and at bedtime.    . rosuvastatin (CRESTOR) 40 MG tablet Take 20 mg by mouth at bedtime.     . sertraline (ZOLOFT) 100 MG tablet Take 100 mg by mouth daily.    . TURMERIC CURCUMIN PO Take 1,000 mg by mouth in the morning and at bedtime.    . verapamil (VERELAN PM) 360 MG 24 hr capsule Take 360 mg by mouth every morning.    Marland Kitchen acetaminophen (TYLENOL) 650 MG CR tablet Take 1,300 mg by mouth every 8 (eight) hours as needed for pain. (Patient not taking: Reported on 01/20/2020)    . aspirin 81 MG tablet Take 81 mg by mouth daily. (Patient not taking: Reported on 01/20/2020)    . beta carotene 47096 UNIT capsule Take 25,000 Units by mouth daily. (Patient not taking: Reported on 01/20/2020)    . calcium carbonate (TUMS - DOSED IN MG ELEMENTAL CALCIUM) 500 MG chewable tablet Chew 1 tablet by mouth daily as needed for indigestion or heartburn. (Patient not taking: Reported on 01/20/2020)    . Calcium-Magnesium-Vitamin D (CALCIUM 500 PO) Take 1 tablet by mouth 2 (two) times daily. (Patient not taking: Reported on 01/20/2020)    . diphenhydrAMINE (BENADRYL) 25 mg capsule Take 25 mg by mouth every 6 (six) hours as needed for allergies. (Patient not taking: Reported on 01/20/2020)    . ferrous sulfate 325 (65 FE) MG tablet Take 325 mg by mouth daily with breakfast. (Patient not taking: Reported on 01/20/2020)    . Fluticasone-Salmeterol (ADVAIR) 100-50 MCG/DOSE AEPB Inhale 1 puff into the lungs 2 (two) times daily. (Patient not taking: Reported on 01/20/2020)    . HYDROcodone-acetaminophen (NORCO) 5-325 MG tablet Take 1-2 tablets by mouth every 6 (six) hours as needed  for moderate pain. (Patient not taking: Reported on 01/20/2020) 60 tablet 0  . latanoprost (XALATAN) 0.005 % ophthalmic solution Place 1 drop into both eyes at bedtime. (Patient not taking: Reported on 01/20/2020)  3  . Magnesium 250 MG TABS Take 1 tablet by mouth daily. (Patient not taking: Reported on 01/20/2020)    . methocarbamol (ROBAXIN) 500 MG tablet Take 500 mg by mouth every 6 (six) hours as needed for muscle spasms.  (Patient not taking: Reported on  01/20/2020)    . methocarbamol (ROBAXIN) 500 MG tablet Take 1 tablet (500 mg total) by mouth 3 (three) times daily as needed. (Patient not taking: Reported on 01/20/2020) 60 tablet 1  . ranitidine (ZANTAC) 150 MG capsule Take 150 mg by mouth every evening.  (Patient not taking: Reported on 01/20/2020)    . SIMBRINZA 1-0.2 % SUSP Place 1 drop into both eyes 2 (two) times daily. (Patient not taking: Reported on 01/20/2020)  3  . vitamin C (ASCORBIC ACID) 500 MG tablet Take 500 mg by mouth daily. (Patient not taking: Reported on 01/20/2020)      No results found for this or any previous visit (from the past 48 hour(s)). No results found.  ROS: Pain with rom of the left upper extremity  Physical Exam: Alert and oriented 69 y.o. female in no acute distress Cranial nerves 2-12 intact Cervical spine: full rom with no tenderness, nv intact distally Chest: active breath sounds bilaterally, no wheeze rhonchi or rales Heart: regular rate and rhythm, no murmur Abd: non tender non distended with active bowel sounds Hip is stable with rom  Left shoulder with painful and weak rom nv intact distally No rashes or edema distally  Assessment/Plan Assessment: left shoulder cuff arthropathy  Plan:  Patient will undergo a left reverse total shoulder by Dr. Ranell Patrick at [facility] Risks benefits and expectations were discussed with the patient. Patient understand risks, benefits and expectations and wishes to proceed. Preoperative templating of the joint  replacement has been completed, documented, and submitted to the Operating Room personnel in order to optimize intra-operative equipment management.   Alphonsa Overall PA-C, MPAS [facility] is now [employer] [address], [place], [place] [address] Phone: [phone] [email or web address] Facebook  [employer]

## 2020-01-23 ENCOUNTER — Encounter (HOSPITAL_COMMUNITY): Payer: Self-pay

## 2020-01-23 ENCOUNTER — Encounter (HOSPITAL_COMMUNITY)
Admission: RE | Admit: 2020-01-23 | Discharge: 2020-01-23 | Disposition: A | Discharge status: Home / Self Care | Payer: Medicare Other | Source: Ambulatory Visit | Attending: Orthopedic Surgery | Admitting: Orthopedic Surgery

## 2020-01-23 ENCOUNTER — Other Ambulatory Visit: Payer: Self-pay

## 2020-01-23 DIAGNOSIS — Z01818 Encounter for other preprocedural examination: Secondary | ICD-10-CM | POA: Insufficient documentation

## 2020-01-23 HISTORY — DX: Other complications of anesthesia, initial encounter: T88.59XA

## 2020-01-23 LAB — BASIC METABOLIC PANEL
Anion gap: 9 (ref 5–15)
BUN: 21 mg/dL (ref 8–23)
CO2: 27 mmol/L (ref 22–32)
Calcium: 9.7 mg/dL (ref 8.9–10.3)
Chloride: 101 mmol/L (ref 98–111)
Creatinine, Ser: 0.75 mg/dL (ref 0.44–1.00)
GFR, Estimated: >60 mL/min (ref >60)
Glucose, Bld: 117 mg/dL — ABNORMAL HIGH (ref 70–99)
Potassium: 3.2 mmol/L — ABNORMAL LOW (ref 3.5–5.1)
Sodium: 137 mmol/L (ref 135–145)

## 2020-01-23 LAB — SURGICAL PCR SCREEN
MRSA, PCR: NEGATIVE
Staphylococcus aureus: NEGATIVE

## 2020-01-23 LAB — CBC
HCT: 40.1 % (ref 36.0–46.0)
Hemoglobin: 12.7 g/dL (ref 12.0–15.0)
MCH: 26.5 pg (ref 26.0–34.0)
MCHC: 31.7 g/dL (ref 30.0–36.0)
MCV: 83.5 fL (ref 80.0–100.0)
Platelets: 334 10*3/uL (ref 150–400)
RBC: 4.8 MIL/uL (ref 3.87–5.11)
RDW: 15.1 % (ref 11.5–15.5)
WBC: 8.3 10*3/uL (ref 4.0–10.5)
nRBC: 0 % (ref 0.0–0.2)

## 2020-01-23 LAB — HEMOGLOBIN A1C
Hgb A1c MFr Bld: 5.9 % — ABNORMAL HIGH (ref 4.8–5.6)
Mean Plasma Glucose: 122.63 mg/dL

## 2020-01-23 NOTE — Progress Notes (Addendum)
Anesthesia Review:  PCP: DR Selena Batten  Have requested on 01/23/20 LOV note and clearance - [phone].  Clearance dated 11/14/19 on chart by Irena Reichmann md along with LOV note dated 10/30/2019  Cardiologist : Chest x-ray : EKG :01/23/20 - final  Echo : Stress test: Cardiac Cath :  Activity level: can do a flight of stairs without difficulty  Sleep Study/ CPAP : no  Fasting Blood Sugar :      / Checks Blood Sugar -- times a day:   Blood Thinner/ Instructions /Last Dose: ASA / Instructions/ Last Dose :  Does not check glucose at home  81 mg AsA- pt reports currently not taking  BMP of 01/23/20 with potassium of 3.2 routed via epic to Dr Ranell Patrick.  HGBA1C done 01/23/20-5.9-epic

## 2020-01-28 ENCOUNTER — Other Ambulatory Visit (HOSPITAL_COMMUNITY)
Admission: RE | Admit: 2020-01-28 | Discharge: 2020-01-28 | Disposition: A | Discharge status: Home / Self Care | Payer: Medicare Other | Source: Ambulatory Visit | Attending: Orthopedic Surgery | Admitting: Orthopedic Surgery

## 2020-01-28 DIAGNOSIS — Z20822 Contact with and (suspected) exposure to covid-19: Secondary | ICD-10-CM | POA: Insufficient documentation

## 2020-01-28 DIAGNOSIS — Z01812 Encounter for preprocedural laboratory examination: Secondary | ICD-10-CM | POA: Diagnosis not present

## 2020-01-28 LAB — SARS CORONAVIRUS 2 (TAT 6-24 HRS): SARS Coronavirus 2: NEGATIVE

## 2020-01-31 ENCOUNTER — Other Ambulatory Visit: Payer: Self-pay

## 2020-01-31 ENCOUNTER — Encounter (HOSPITAL_COMMUNITY): Payer: Self-pay | Admitting: Orthopedic Surgery

## 2020-01-31 ENCOUNTER — Ambulatory Visit (HOSPITAL_COMMUNITY): Payer: Medicare Other | Admitting: Physician Assistant

## 2020-01-31 ENCOUNTER — Observation Stay (HOSPITAL_COMMUNITY): Payer: Medicare Other

## 2020-01-31 ENCOUNTER — Encounter (HOSPITAL_COMMUNITY)
Admission: RE | Disposition: A | Discharge status: Home / Self Care | Payer: Self-pay | Source: Home / Self Care | Attending: Orthopedic Surgery

## 2020-01-31 ENCOUNTER — Ambulatory Visit (HOSPITAL_COMMUNITY): Payer: Medicare Other | Admitting: Certified Registered"

## 2020-01-31 ENCOUNTER — Observation Stay (HOSPITAL_COMMUNITY)
Admission: RE | Admit: 2020-01-31 | Discharge: 2020-02-01 | Disposition: A | Discharge status: Home / Self Care | Payer: Medicare Other | Attending: Orthopedic Surgery | Admitting: Orthopedic Surgery

## 2020-01-31 DIAGNOSIS — Z7984 Long term (current) use of oral hypoglycemic drugs: Secondary | ICD-10-CM | POA: Insufficient documentation

## 2020-01-31 DIAGNOSIS — Z7982 Long term (current) use of aspirin: Secondary | ICD-10-CM | POA: Insufficient documentation

## 2020-01-31 DIAGNOSIS — M75122 Complete rotator cuff tear or rupture of left shoulder, not specified as traumatic: Secondary | ICD-10-CM | POA: Diagnosis not present

## 2020-01-31 DIAGNOSIS — J45909 Unspecified asthma, uncomplicated: Secondary | ICD-10-CM | POA: Insufficient documentation

## 2020-01-31 DIAGNOSIS — I10 Essential (primary) hypertension: Secondary | ICD-10-CM | POA: Diagnosis not present

## 2020-01-31 DIAGNOSIS — M19012 Primary osteoarthritis, left shoulder: Secondary | ICD-10-CM | POA: Diagnosis not present

## 2020-01-31 DIAGNOSIS — Z471 Aftercare following joint replacement surgery: Secondary | ICD-10-CM | POA: Diagnosis not present

## 2020-01-31 DIAGNOSIS — E119 Type 2 diabetes mellitus without complications: Secondary | ICD-10-CM | POA: Insufficient documentation

## 2020-01-31 DIAGNOSIS — Z96612 Presence of left artificial shoulder joint: Secondary | ICD-10-CM | POA: Diagnosis not present

## 2020-01-31 DIAGNOSIS — K219 Gastro-esophageal reflux disease without esophagitis: Secondary | ICD-10-CM | POA: Diagnosis not present

## 2020-01-31 DIAGNOSIS — D509 Iron deficiency anemia, unspecified: Secondary | ICD-10-CM | POA: Diagnosis not present

## 2020-01-31 DIAGNOSIS — M75102 Unspecified rotator cuff tear or rupture of left shoulder, not specified as traumatic: Secondary | ICD-10-CM | POA: Diagnosis not present

## 2020-01-31 DIAGNOSIS — Z79899 Other long term (current) drug therapy: Secondary | ICD-10-CM | POA: Insufficient documentation

## 2020-01-31 DIAGNOSIS — Z96611 Presence of right artificial shoulder joint: Secondary | ICD-10-CM | POA: Insufficient documentation

## 2020-01-31 DIAGNOSIS — M12812 Other specific arthropathies, not elsewhere classified, left shoulder: Secondary | ICD-10-CM | POA: Diagnosis not present

## 2020-01-31 DIAGNOSIS — G8918 Other acute postprocedural pain: Secondary | ICD-10-CM | POA: Diagnosis not present

## 2020-01-31 HISTORY — PX: REVERSE SHOULDER ARTHROPLASTY: SHX5054

## 2020-01-31 LAB — GLUCOSE, CAPILLARY
Glucose-Capillary: 110 mg/dL — ABNORMAL HIGH (ref 70–99)
Glucose-Capillary: 122 mg/dL — ABNORMAL HIGH (ref 70–99)
Glucose-Capillary: 123 mg/dL — ABNORMAL HIGH (ref 70–99)
Glucose-Capillary: 264 mg/dL — ABNORMAL HIGH (ref 70–99)

## 2020-01-31 SURGERY — ARTHROPLASTY, SHOULDER, TOTAL, REVERSE
Anesthesia: General | Site: Shoulder | Laterality: Left

## 2020-01-31 MED ORDER — INSULIN ASPART 100 UNIT/ML ~~LOC~~ SOLN
0.0000 [IU] | Freq: Every day | SUBCUTANEOUS | Status: DC
  Administered 2020-01-31: 3 [IU] via SUBCUTANEOUS

## 2020-01-31 MED ORDER — VERAPAMIL HCL ER 180 MG PO TBCR
360.0000 mg | EXTENDED_RELEASE_TABLET | Freq: Every morning | ORAL | Status: DC
  Administered 2020-02-01: 10:00:00 360 mg via ORAL
  Filled 2020-01-31: qty 2

## 2020-01-31 MED ORDER — ROSUVASTATIN CALCIUM 20 MG PO TABS
20.0000 mg | ORAL_TABLET | Freq: Every day | ORAL | Status: DC
  Administered 2020-01-31: 21:00:00 20 mg via ORAL
  Filled 2020-01-31: qty 1

## 2020-01-31 MED ORDER — HYDROCODONE-ACETAMINOPHEN 5-325 MG PO TABS: 1.0000 | ORAL_TABLET | ORAL | Status: DC | PRN

## 2020-01-31 MED ORDER — EPHEDRINE 5 MG/ML INJ
INTRAVENOUS | Status: AC
  Filled 2020-01-31: qty 10

## 2020-01-31 MED ORDER — MIDAZOLAM HCL 2 MG/2ML IJ SOLN
0.5000 mg | Freq: Once | INTRAMUSCULAR | Status: DC
  Filled 2020-01-31: qty 2

## 2020-01-31 MED ORDER — MENTHOL 3 MG MT LOZG: 1.0000 | LOZENGE | OROMUCOSAL | Status: DC | PRN

## 2020-01-31 MED ORDER — VITAMIN D 25 MCG (1000 UNIT) PO TABS
1000.0000 [IU] | ORAL_TABLET | Freq: Every day | ORAL | Status: DC
  Administered 2020-02-01: 10:00:00 1000 [IU] via ORAL
  Filled 2020-01-31: qty 1

## 2020-01-31 MED ORDER — ONDANSETRON HCL 4 MG/2ML IJ SOLN
INTRAMUSCULAR | Status: DC | PRN
  Administered 2020-01-31: 4 mg via INTRAVENOUS

## 2020-01-31 MED ORDER — CEFAZOLIN SODIUM-DEXTROSE 2-4 GM/100ML-% IV SOLN
2.0000 g | Freq: Four times a day (QID) | INTRAVENOUS | Status: DC
  Administered 2020-01-31 – 2020-02-01 (×2): 2 g via INTRAVENOUS
  Filled 2020-01-31 (×3): qty 100

## 2020-01-31 MED ORDER — FERROUS SULFATE 325 (65 FE) MG PO TABS: 325.0000 mg | ORAL_TABLET | Freq: Every day | ORAL | Status: DC

## 2020-01-31 MED ORDER — BUPIVACAINE-EPINEPHRINE (PF) 0.25% -1:200000 IJ SOLN
INTRAMUSCULAR | Status: DC | PRN
  Administered 2020-01-31: 11 mL

## 2020-01-31 MED ORDER — FENTANYL CITRATE (PF) 250 MCG/5ML IJ SOLN
INTRAMUSCULAR | Status: DC | PRN
  Administered 2020-01-31 (×2): 25 ug via INTRAVENOUS
  Administered 2020-01-31: 50 ug via INTRAVENOUS

## 2020-01-31 MED ORDER — ACETAMINOPHEN 325 MG PO TABS
325.0000 mg | ORAL_TABLET | Freq: Four times a day (QID) | ORAL | Status: DC | PRN
  Administered 2020-02-01: 650 mg via ORAL
  Filled 2020-01-31: qty 2

## 2020-01-31 MED ORDER — PROPOFOL 10 MG/ML IV BOLUS
INTRAVENOUS | Status: DC | PRN
  Administered 2020-01-31: 120 mg via INTRAVENOUS
  Administered 2020-01-31: 20 mg via INTRAVENOUS

## 2020-01-31 MED ORDER — ALBUTEROL SULFATE (2.5 MG/3ML) 0.083% IN NEBU: 3.0000 mL | INHALATION_SOLUTION | Freq: Four times a day (QID) | RESPIRATORY_TRACT | Status: DC | PRN

## 2020-01-31 MED ORDER — ONDANSETRON HCL 4 MG/2ML IJ SOLN
INTRAMUSCULAR | Status: AC
  Filled 2020-01-31: qty 2

## 2020-01-31 MED ORDER — PROPOFOL 10 MG/ML IV BOLUS
INTRAVENOUS | Status: AC
  Filled 2020-01-31: qty 20

## 2020-01-31 MED ORDER — DEXAMETHASONE SODIUM PHOSPHATE 10 MG/ML IJ SOLN
INTRAMUSCULAR | Status: DC | PRN
  Administered 2020-01-31: 10 mg via INTRAVENOUS

## 2020-01-31 MED ORDER — PHENYLEPHRINE HCL-NACL 10-0.9 MG/250ML-% IV SOLN
INTRAVENOUS | Status: DC | PRN
  Administered 2020-01-31: 20 ug/min via INTRAVENOUS

## 2020-01-31 MED ORDER — MONTELUKAST SODIUM 10 MG PO TABS
10.0000 mg | ORAL_TABLET | Freq: Every day | ORAL | Status: DC
  Administered 2020-01-31: 21:00:00 10 mg via ORAL
  Filled 2020-01-31: qty 1

## 2020-01-31 MED ORDER — 0.9 % SODIUM CHLORIDE (POUR BTL) OPTIME
TOPICAL | Status: DC | PRN
  Administered 2020-01-31: 1000 mL

## 2020-01-31 MED ORDER — BUPIVACAINE HCL (PF) 0.5 % IJ SOLN
INTRAMUSCULAR | Status: DC | PRN
  Administered 2020-01-31: 15 mL via PERINEURAL

## 2020-01-31 MED ORDER — INSULIN ASPART 100 UNIT/ML ~~LOC~~ SOLN
4.0000 [IU] | Freq: Three times a day (TID) | SUBCUTANEOUS | Status: DC
  Administered 2020-02-01: 10:00:00 4 [IU] via SUBCUTANEOUS

## 2020-01-31 MED ORDER — METHOCARBAMOL 500 MG PO TABS: 500.0000 mg | ORAL_TABLET | Freq: Four times a day (QID) | ORAL | Status: DC | PRN

## 2020-01-31 MED ORDER — EPHEDRINE SULFATE-NACL 50-0.9 MG/10ML-% IV SOSY
PREFILLED_SYRINGE | INTRAVENOUS | Status: DC | PRN
  Administered 2020-01-31: 5 mg via INTRAVENOUS
  Administered 2020-01-31 (×4): 10 mg via INTRAVENOUS
  Administered 2020-01-31: 5 mg via INTRAVENOUS

## 2020-01-31 MED ORDER — FENTANYL CITRATE (PF) 100 MCG/2ML IJ SOLN: 25.0000 ug | INTRAMUSCULAR | Status: DC | PRN

## 2020-01-31 MED ORDER — OXYCODONE HCL 5 MG PO TABS: 5.0000 mg | ORAL_TABLET | Freq: Once | ORAL | Status: DC | PRN

## 2020-01-31 MED ORDER — BUPIVACAINE-EPINEPHRINE (PF) 0.25% -1:200000 IJ SOLN
INTRAMUSCULAR | Status: AC
  Filled 2020-01-31: qty 30

## 2020-01-31 MED ORDER — POLYETHYLENE GLYCOL 3350 17 G PO PACK: 17.0000 g | PACK | Freq: Every day | ORAL | Status: DC | PRN

## 2020-01-31 MED ORDER — LATANOPROST 0.005 % OP SOLN: 1.0000 [drp] | Freq: Every day | OPHTHALMIC | Status: DC

## 2020-01-31 MED ORDER — SODIUM CHLORIDE 0.9 % IV SOLN: INTRAVENOUS | Status: DC

## 2020-01-31 MED ORDER — LIDOCAINE 2% (20 MG/ML) 5 ML SYRINGE
INTRAMUSCULAR | Status: AC
  Filled 2020-01-31: qty 5

## 2020-01-31 MED ORDER — CYCLOBENZAPRINE HCL 5 MG PO TABS: 5.0000 mg | ORAL_TABLET | Freq: Three times a day (TID) | ORAL | Status: DC | PRN

## 2020-01-31 MED ORDER — DEXAMETHASONE SODIUM PHOSPHATE 10 MG/ML IJ SOLN
INTRAMUSCULAR | Status: AC
  Filled 2020-01-31: qty 1

## 2020-01-31 MED ORDER — MAGNESIUM 250 MG PO TABS: 1.0000 | ORAL_TABLET | Freq: Every day | ORAL | Status: DC

## 2020-01-31 MED ORDER — ONDANSETRON HCL 4 MG/2ML IJ SOLN: 4.0000 mg | Freq: Four times a day (QID) | INTRAMUSCULAR | Status: DC | PRN

## 2020-01-31 MED ORDER — FENTANYL CITRATE (PF) 100 MCG/2ML IJ SOLN
INTRAMUSCULAR | Status: AC
  Filled 2020-01-31: qty 2

## 2020-01-31 MED ORDER — FENTANYL CITRATE (PF) 100 MCG/2ML IJ SOLN
25.0000 ug | Freq: Once | INTRAMUSCULAR | Status: AC
  Administered 2020-01-31: 50 ug via INTRAVENOUS
  Filled 2020-01-31: qty 2

## 2020-01-31 MED ORDER — DOCUSATE SODIUM 100 MG PO CAPS
100.0000 mg | ORAL_CAPSULE | Freq: Two times a day (BID) | ORAL | Status: DC
  Administered 2020-01-31 – 2020-02-01 (×2): 100 mg via ORAL
  Filled 2020-01-31 (×2): qty 1

## 2020-01-31 MED ORDER — SUCCINYLCHOLINE CHLORIDE 200 MG/10ML IV SOSY
PREFILLED_SYRINGE | INTRAVENOUS | Status: DC | PRN
  Administered 2020-01-31: 100 mg via INTRAVENOUS

## 2020-01-31 MED ORDER — DORZOLAMIDE HCL-TIMOLOL MAL 2-0.5 % OP SOLN
1.0000 [drp] | Freq: Two times a day (BID) | OPHTHALMIC | Status: DC
  Administered 2020-01-31 – 2020-02-01 (×2): 1 [drp] via OPHTHALMIC
  Filled 2020-01-31: qty 10

## 2020-01-31 MED ORDER — LORATADINE 10 MG PO TABS
10.0000 mg | ORAL_TABLET | Freq: Every day | ORAL | Status: DC
  Administered 2020-02-01: 10 mg via ORAL
  Filled 2020-01-31: qty 1

## 2020-01-31 MED ORDER — ACETAMINOPHEN ER 650 MG PO TBCR: 1300.0000 mg | EXTENDED_RELEASE_TABLET | Freq: Three times a day (TID) | ORAL | Status: DC

## 2020-01-31 MED ORDER — SERTRALINE HCL 50 MG PO TABS
100.0000 mg | ORAL_TABLET | Freq: Every day | ORAL | Status: DC
  Administered 2020-01-31 – 2020-02-01 (×2): 100 mg via ORAL
  Filled 2020-01-31 (×2): qty 2

## 2020-01-31 MED ORDER — CEFAZOLIN SODIUM-DEXTROSE 2-4 GM/100ML-% IV SOLN
2.0000 g | INTRAVENOUS | Status: AC
  Administered 2020-01-31: 2 g via INTRAVENOUS
  Filled 2020-01-31: qty 100

## 2020-01-31 MED ORDER — LIDOCAINE 2% (20 MG/ML) 5 ML SYRINGE
INTRAMUSCULAR | Status: DC | PRN
  Administered 2020-01-31: 40 mg via INTRAVENOUS

## 2020-01-31 MED ORDER — INDAPAMIDE 1.25 MG PO TABS
2.5000 mg | ORAL_TABLET | Freq: Every day | ORAL | Status: DC
  Administered 2020-02-01: 10:00:00 2.5 mg via ORAL
  Filled 2020-01-31: qty 2

## 2020-01-31 MED ORDER — PHENYLEPHRINE HCL-NACL 10-0.9 MG/250ML-% IV SOLN
INTRAVENOUS | Status: AC
  Filled 2020-01-31: qty 250

## 2020-01-31 MED ORDER — ONDANSETRON HCL 4 MG/2ML IJ SOLN: 4.0000 mg | Freq: Once | INTRAMUSCULAR | Status: DC | PRN

## 2020-01-31 MED ORDER — ORAL CARE MOUTH RINSE: 15.0000 mL | Freq: Once | OROMUCOSAL | Status: AC

## 2020-01-31 MED ORDER — BUPIVACAINE LIPOSOME 1.3 % IJ SUSP
INTRAMUSCULAR | Status: DC | PRN
  Administered 2020-01-31: 10 mL via PERINEURAL

## 2020-01-31 MED ORDER — PHENOL 1.4 % MT LIQD: 1.0000 | OROMUCOSAL | Status: DC | PRN

## 2020-01-31 MED ORDER — OXYCODONE HCL 5 MG/5ML PO SOLN: 5.0000 mg | Freq: Once | ORAL | Status: DC | PRN

## 2020-01-31 MED ORDER — CHLORHEXIDINE GLUCONATE 0.12 % MT SOLN
15.0000 mL | Freq: Once | OROMUCOSAL | Status: AC
  Administered 2020-01-31: 15 mL via OROMUCOSAL

## 2020-01-31 MED ORDER — METOCLOPRAMIDE HCL 5 MG/ML IJ SOLN: 5.0000 mg | Freq: Three times a day (TID) | INTRAMUSCULAR | Status: DC | PRN

## 2020-01-31 MED ORDER — BISACODYL 10 MG RE SUPP: 10.0000 mg | Freq: Every day | RECTAL | Status: DC | PRN

## 2020-01-31 MED ORDER — MOMETASONE FURO-FORMOTEROL FUM 100-5 MCG/ACT IN AERO
2.0000 | INHALATION_SPRAY | Freq: Two times a day (BID) | RESPIRATORY_TRACT | Status: DC
Start: 2020-01-31 — End: 2020-01-31

## 2020-01-31 MED ORDER — ONDANSETRON HCL 4 MG PO TABS: 4.0000 mg | ORAL_TABLET | Freq: Four times a day (QID) | ORAL | Status: DC | PRN

## 2020-01-31 MED ORDER — PREDNISOLONE ACETATE 1 % OP SUSP
1.0000 [drp] | Freq: Two times a day (BID) | OPHTHALMIC | Status: DC
  Administered 2020-01-31 – 2020-02-01 (×2): 1 [drp] via OPHTHALMIC
  Filled 2020-01-31: qty 5

## 2020-01-31 MED ORDER — CO Q 10 100 MG PO CAPS: 100.0000 mg | ORAL_CAPSULE | Freq: Every day | ORAL | Status: DC

## 2020-01-31 MED ORDER — MORPHINE SULFATE (PF) 2 MG/ML IV SOLN: 0.5000 mg | INTRAVENOUS | Status: DC | PRN

## 2020-01-31 MED ORDER — LACTATED RINGERS IV SOLN: INTRAVENOUS | Status: DC

## 2020-01-31 MED ORDER — ROCURONIUM BROMIDE 10 MG/ML (PF) SYRINGE
PREFILLED_SYRINGE | INTRAVENOUS | Status: AC
  Filled 2020-01-31: qty 10

## 2020-01-31 MED ORDER — HYDROCODONE-ACETAMINOPHEN 5-325 MG PO TABS: 1.0000 | ORAL_TABLET | Freq: Four times a day (QID) | ORAL | 0 refills | Status: AC | PRN

## 2020-01-31 MED ORDER — METOCLOPRAMIDE HCL 5 MG PO TABS: 5.0000 mg | ORAL_TABLET | Freq: Three times a day (TID) | ORAL | Status: DC | PRN

## 2020-01-31 MED ORDER — METFORMIN HCL 500 MG PO TABS
500.0000 mg | ORAL_TABLET | Freq: Every day | ORAL | Status: DC
  Administered 2020-02-01: 500 mg via ORAL
  Filled 2020-01-31: qty 1

## 2020-01-31 MED ORDER — ADULT MULTIVITAMIN W/MINERALS CH
1.0000 | ORAL_TABLET | Freq: Every day | ORAL | Status: DC
  Administered 2020-01-31 – 2020-02-01 (×2): 1 via ORAL
  Filled 2020-01-31 (×2): qty 1

## 2020-01-31 MED ORDER — STERILE WATER FOR IRRIGATION IR SOLN
Status: DC | PRN
  Administered 2020-01-31: 2000 mL

## 2020-01-31 MED ORDER — INSULIN ASPART 100 UNIT/ML ~~LOC~~ SOLN
0.0000 [IU] | Freq: Three times a day (TID) | SUBCUTANEOUS | Status: DC
  Administered 2020-02-01: 2 [IU] via SUBCUTANEOUS

## 2020-01-31 MED ORDER — ASCORBIC ACID 500 MG PO TABS: 500.0000 mg | ORAL_TABLET | Freq: Every day | ORAL | Status: DC

## 2020-01-31 SURGICAL SUPPLY — 73 items
AID PSTN UNV HD RSTRNT DISP (MISCELLANEOUS) ×1
BAG SPEC THK2 15X12 ZIP CLS (MISCELLANEOUS)
BAG ZIPLOCK 12X15 (MISCELLANEOUS) IMPLANT
BIT DRILL 1.6MX128 (BIT) IMPLANT
BIT DRILL 1.6MX128MM (BIT)
BIT DRILL 170X2.5X (BIT) ×1 IMPLANT
BIT DRL 170X2.5X (BIT) ×1
BLADE SAG 18X100X1.27 (BLADE) ×3 IMPLANT
CLOSURE WOUND 1/2 X4 (GAUZE/BANDAGES/DRESSINGS) ×1
COVER BACK TABLE 60X90IN (DRAPES) ×3 IMPLANT
COVER SURGICAL LIGHT HANDLE (MISCELLANEOUS) ×3 IMPLANT
COVER WAND RF STERILE (DRAPES) IMPLANT
DECANTER SPIKE VIAL GLASS SM (MISCELLANEOUS) ×3 IMPLANT
DRAPE INCISE IOBAN 66X45 STRL (DRAPES) ×3 IMPLANT
DRAPE ORTHO SPLIT 77X108 STRL (DRAPES) ×6
DRAPE SHEET LG 3/4 BI-LAMINATE (DRAPES) ×3 IMPLANT
DRAPE SURG ORHT 6 SPLT 77X108 (DRAPES) ×2 IMPLANT
DRAPE TOP 10253 STERILE (DRAPES) ×3 IMPLANT
DRAPE U-SHAPE 47X51 STRL (DRAPES) ×3 IMPLANT
DRILL 2.5 (BIT) ×3
DRSG ADAPTIC 3X8 NADH LF (GAUZE/BANDAGES/DRESSINGS) ×3 IMPLANT
DRSG PAD ABDOMINAL 8X10 ST (GAUZE/BANDAGES/DRESSINGS) ×3 IMPLANT
DURAPREP 26ML APPLICATOR (WOUND CARE) ×3 IMPLANT
ELECT BLADE TIP CTD 4 INCH (ELECTRODE) ×3 IMPLANT
ELECT NEEDLE TIP 2.8 STRL (NEEDLE) ×6 IMPLANT
ELECT REM PT RETURN 15FT ADLT (MISCELLANEOUS) ×3 IMPLANT
EPI LT SZ 1 (Orthopedic Implant) ×3 IMPLANT
EPIPHYSIS LT SZ 1 (Orthopedic Implant) ×1 IMPLANT
FACESHIELD WRAPAROUND (MASK) ×3 IMPLANT
GAUZE SPONGE 4X4 12PLY STRL (GAUZE/BANDAGES/DRESSINGS) ×3 IMPLANT
GLENOSPHERE XTEND RSA 38 SD +2 (Joint) ×3 IMPLANT
GLOVE BIOGEL PI ORTHO PRO 7.5 (GLOVE) ×2
GLOVE BIOGEL PI ORTHO PRO SZ8 (GLOVE) ×2
GLOVE ORTHO TXT STRL SZ7.5 (GLOVE) ×3 IMPLANT
GLOVE PI ORTHO PRO STRL 7.5 (GLOVE) ×1 IMPLANT
GLOVE PI ORTHO PRO STRL SZ8 (GLOVE) ×1 IMPLANT
GLOVE SURG ORTHO 8.5 STRL (GLOVE) ×3 IMPLANT
GOWN STRL REUS W/TWL XL LVL3 (GOWN DISPOSABLE) ×6 IMPLANT
KIT BASIN OR (CUSTOM PROCEDURE TRAY) ×3 IMPLANT
KIT TURNOVER KIT A (KITS) IMPLANT
MANIFOLD NEPTUNE II (INSTRUMENTS) ×3 IMPLANT
METAGLENE DELTA EXTEND (Trauma) ×1 IMPLANT
METAGLENE DXTEND (Trauma) ×3 IMPLANT
NEEDLE MAYO CATGUT SZ4 (NEEDLE) IMPLANT
NS IRRIG 1000ML POUR BTL (IV SOLUTION) ×3 IMPLANT
PACK SHOULDER (CUSTOM PROCEDURE TRAY) ×3 IMPLANT
PENCIL SMOKE EVACUATOR (MISCELLANEOUS) ×3 IMPLANT
PIN GUIDE 1.2 (PIN) ×3 IMPLANT
PIN GUIDE GLENOPHERE 1.5MX300M (PIN) ×3 IMPLANT
PIN METAGLENE 2.5 (PIN) ×6 IMPLANT
PROTECTOR NERVE ULNAR (MISCELLANEOUS) ×3 IMPLANT
RESTRAINT HEAD UNIVERSAL NS (MISCELLANEOUS) ×3 IMPLANT
SCREW 4.5X24MM (Screw) ×3 IMPLANT
SCREW BN 24X4.5XLCK STRL (Screw) ×1 IMPLANT
SCREW LOCK 42 (Screw) ×3 IMPLANT
SCREW LOCK DELTA XTEND 4.5X30 (Screw) ×3 IMPLANT
SLING ARM FOAM STRAP LRG (SOFTGOODS) ×3 IMPLANT
SMARTMIX MINI TOWER (MISCELLANEOUS)
SPACER 38 PLUS 3 (Spacer) ×3 IMPLANT
SPONGE LAP 4X18 RFD (DISPOSABLE) IMPLANT
STEM DELTA DIA 10 HA (Stem) ×3 IMPLANT
STRIP CLOSURE SKIN 1/2X4 (GAUZE/BANDAGES/DRESSINGS) ×2 IMPLANT
SUCTION FRAZIER HANDLE 10FR (MISCELLANEOUS) ×3
SUCTION TUBE FRAZIER 10FR DISP (MISCELLANEOUS) ×1 IMPLANT
SUT FIBERWIRE #2 38 T-5 BLUE (SUTURE) ×6
SUT MNCRL AB 4-0 PS2 18 (SUTURE) ×3 IMPLANT
SUT VIC AB 0 CT1 36 (SUTURE) ×6 IMPLANT
SUT VIC AB 0 CT2 27 (SUTURE) ×3 IMPLANT
SUT VIC AB 2-0 CT1 27 (SUTURE) ×6
SUT VIC AB 2-0 CT1 TAPERPNT 27 (SUTURE) ×2 IMPLANT
SUTURE FIBERWR #2 38 T-5 BLUE (SUTURE) ×2 IMPLANT
TOWEL OR 17X26 10 PK STRL BLUE (TOWEL DISPOSABLE) ×3 IMPLANT
TOWER SMARTMIX MINI (MISCELLANEOUS) IMPLANT

## 2020-01-31 NOTE — Discharge Instructions (Signed)
Ice to the shoulder constantly.  Keep the incision covered and clean and dry for one week, then ok to get it wet in the shower. ° °Do exercise as instructed several times per day. ° °DO NOT reach behind your back or push up out of a chair with the operative arm. ° °Use a sling while you are up and around for comfort, may remove while seated.  Keep pillow propped behind the operative elbow. ° °Follow up with Dr Montez Cuda in two weeks in the office, call [phone] for appt °

## 2020-01-31 NOTE — Interval H&P Note (Signed)
History and Physical Interval Note:  01/31/2020 12:42 PM  Amanda Odonnell  has presented today for surgery, with the diagnosis of Left shoulder cuff arthropathy.  The various methods of treatment have been discussed with the patient and family. After consideration of risks, benefits and other options for treatment, the patient has consented to  Procedure(s) with comments: REVERSE SHOULDER ARTHROPLASTY (Left) - interscalene block as a surgical intervention.  The patient's history has been reviewed, patient examined, no change in status, stable for surgery.  I have reviewed the patient's chart and labs.  Questions were answered to the patient's satisfaction.     Verlee Rossetti

## 2020-01-31 NOTE — Progress Notes (Signed)
Assisted Dr. Rose with left, ultrasound guided, interscalene  block. Side rails up, monitors on throughout procedure. See vital signs in flow sheet. Tolerated Procedure well.  

## 2020-01-31 NOTE — Anesthesia Procedure Notes (Signed)
Anesthesia Regional Block: Interscalene brachial plexus block   Pre-Anesthetic Checklist: ,, timeout performed, Correct Patient, Correct Site, Correct Laterality, Correct Procedure, Correct Position, site marked, Risks and benefits discussed,  Surgical consent,  Pre-op evaluation,  At surgeon's request and post-op pain management  Laterality: Left  Prep: chloraprep       Needles:  Injection technique: Single-shot  Needle Type: Echogenic Needle     Needle Length: 9cm      Additional Needles:   Procedures:,,,, ultrasound used (permanent image in chart),,,,  Narrative:  Start time: 01/31/2020 12:17 PM End time: 01/31/2020 12:23 PM Injection made incrementally with aspirations every 5 mL.  Performed by: Personally  Anesthesiologist: Eilene Ghazi, MD  Additional Notes: Patient tolerated the procedure well without complications

## 2020-01-31 NOTE — Anesthesia Procedure Notes (Signed)
Procedure Name: Intubation Date/Time: 01/31/2020 1:26 PM Performed by: Eben Burow, CRNA Pre-anesthesia Checklist: Patient identified, Emergency Drugs available, Suction available, Patient being monitored and Timeout performed Patient Re-evaluated:Patient Re-evaluated prior to induction Oxygen Delivery Method: Circle system utilized Preoxygenation: Pre-oxygenation with 100% oxygen Induction Type: IV induction Ventilation: Mask ventilation without difficulty Laryngoscope Size: Mac and 4 Grade View: Grade I Tube type: Oral Tube size: 7.0 mm Number of attempts: 1 Airway Equipment and Method: Stylet Placement Confirmation: ETT inserted through vocal cords under direct vision,  positive ETCO2 and breath sounds checked- equal and bilateral Secured at: 21 cm Tube secured with: Tape Dental Injury: Teeth and Oropharynx as per pre-operative assessment

## 2020-01-31 NOTE — Brief Op Note (Signed)
01/31/2020  3:02 PM  PATIENT:  Amanda Odonnell  69 y.o. female  PRE-OPERATIVE DIAGNOSIS:  Left shoulder cuff tear arthropathy  POST-OPERATIVE DIAGNOSIS:  Left shoulder cuff tear arthropathy  PROCEDURE:  Procedure(s) with comments: REVERSE SHOULDER ARTHROPLASTY (Left) - interscalene block DePuy Delta Xtend, no subscap repair  SURGEON:  Surgeon(s) and Role:    Beverely Low, MD - Primary  PHYSICIAN ASSISTANT:   ASSISTANTS: Thea Gist, PA-C   ANESTHESIA:   regional and general  EBL:  200 mL   BLOOD ADMINISTERED:none  DRAINS: none   LOCAL MEDICATIONS USED:  MARCAINE     SPECIMEN:  No Specimen  DISPOSITION OF SPECIMEN:  N/A  COUNTS:  YES  TOURNIQUET:  * No tourniquets in log *  DICTATION: .Other Dictation: Dictation Number [id number]  PLAN OF CARE: Admit to inpatient   PATIENT DISPOSITION:  PACU - hemodynamically stable.   Delay start of Pharmacological VTE agent (>24hrs) due to surgical blood loss or risk of bleeding: not applicable

## 2020-01-31 NOTE — Anesthesia Postprocedure Evaluation (Signed)
Anesthesia Post Note  Patient: Amanda Odonnell  Procedure(s) Performed: REVERSE SHOULDER ARTHROPLASTY (Left Shoulder)     Patient location during evaluation: PACU Anesthesia Type: General Level of consciousness: awake and alert Pain management: pain level controlled Vital Signs Assessment: post-procedure vital signs reviewed and stable Respiratory status: spontaneous breathing, nonlabored ventilation, respiratory function stable and patient connected to nasal cannula oxygen Cardiovascular status: blood pressure returned to baseline and stable Postop Assessment: no apparent nausea or vomiting Anesthetic complications: no   No complications documented.  Last Vitals:  Vitals:   01/31/20 1545 01/31/20 1600  BP: 121/75 132/77  Pulse: 70 69  Resp: 12 14  Temp:    SpO2: 97% 95%    Last Pain:  Vitals:   01/31/20 1600  TempSrc:   PainSc: 0-No pain                 Romulo Okray S

## 2020-01-31 NOTE — Anesthesia Procedure Notes (Signed)
Anesthesia Procedure Image    

## 2020-01-31 NOTE — Transfer of Care (Signed)
Immediate Anesthesia Transfer of Care Note  Patient: Amanda Odonnell  Procedure(s) Performed: REVERSE SHOULDER ARTHROPLASTY (Left Shoulder)  Patient Location: PACU  Anesthesia Type:GA combined with regional for post-op pain  Level of Consciousness: awake, patient cooperative and responds to stimulation  Airway & Oxygen Therapy: Patient Spontanous Breathing and Patient connected to face mask oxygen  Post-op Assessment: Report given to RN and Post -op Vital signs reviewed and unstable, Anesthesiologist notified  Post vital signs: Reviewed and stable  Last Vitals:  Vitals Value Taken Time  BP 130/98 01/31/20 1506  Temp    Pulse 81 01/31/20 1509  Resp 18 01/31/20 1509  SpO2 96 % 01/31/20 1509  Vitals shown include unvalidated device data.  Last Pain:  Vitals:   01/31/20 1235  TempSrc:   PainSc: Asleep      Patients Stated Pain Goal: 3 (01/31/20 1102)  Complications: No complications documented.

## 2020-01-31 NOTE — Anesthesia Preprocedure Evaluation (Signed)
Anesthesia Evaluation  Patient identified by MRN, date of birth, ID band Patient awake    Reviewed: Allergy & Precautions, NPO status , Patient's Chart, lab work & pertinent test results  Airway Mallampati: II  TM Distance: >3 FB Neck ROM: Full    Dental no notable dental hx.    Pulmonary asthma ,    Pulmonary exam normal breath sounds clear to auscultation       Cardiovascular hypertension, Pt. on medications Normal cardiovascular exam Rhythm:Regular Rate:Normal     Neuro/Psych negative neurological ROS  negative psych ROS   GI/Hepatic Neg liver ROS, GERD  ,  Endo/Other  diabetes  Renal/GU negative Renal ROS  negative genitourinary   Musculoskeletal negative musculoskeletal ROS (+)   Abdominal   Peds negative pediatric ROS (+)  Hematology negative hematology ROS (+)   Anesthesia Other Findings   Reproductive/Obstetrics negative OB ROS                             Anesthesia Physical Anesthesia Plan  ASA: III  Anesthesia Plan: General   Post-op Pain Management:  Regional for Post-op pain   Induction: Intravenous  PONV Risk Score and Plan: 3 and Ondansetron, Dexamethasone and Treatment may vary due to age or medical condition  Airway Management Planned: Oral ETT  Additional Equipment:   Intra-op Plan:   Post-operative Plan: Extubation in OR  Informed Consent: I have reviewed the patients History and Physical, chart, labs and discussed the procedure including the risks, benefits and alternatives for the proposed anesthesia with the patient or authorized representative who has indicated his/her understanding and acceptance.     Dental advisory given  Plan Discussed with: CRNA and Surgeon  Anesthesia Plan Comments:         Anesthesia Quick Evaluation

## 2020-01-31 NOTE — Progress Notes (Signed)
PHARMACIST - PHYSICIAN ORDER COMMUNICATION  CONCERNING: P&T Medication Policy on Herbal Medications  DESCRIPTION:  This patients order for:  Coenzyme Q10  has been noted.  This product(s) is classified as an herbal or natural product. Due to a lack of definitive safety studies or FDA approval, nonstandard manufacturing practices, plus the potential risk of unknown drug-drug interactions while on inpatient medications, the Pharmacy and Therapeutics Committee does not permit the use of herbal or natural products of this type within [facility].   ACTION TAKEN: The pharmacy department is unable to verify this order at this time and your patient has been informed of this safety policy. Please reevaluate patients clinical condition at discharge and address if the herbal or natural product(s) should be resumed at that time.  Bernadene Person, PharmD, BCPS [phone] 01/31/2020, 5:28 PM

## 2020-02-01 DIAGNOSIS — Z96611 Presence of right artificial shoulder joint: Secondary | ICD-10-CM | POA: Diagnosis not present

## 2020-02-01 DIAGNOSIS — Z7982 Long term (current) use of aspirin: Secondary | ICD-10-CM | POA: Diagnosis not present

## 2020-02-01 DIAGNOSIS — E119 Type 2 diabetes mellitus without complications: Secondary | ICD-10-CM | POA: Diagnosis not present

## 2020-02-01 DIAGNOSIS — M19012 Primary osteoarthritis, left shoulder: Secondary | ICD-10-CM | POA: Diagnosis not present

## 2020-02-01 DIAGNOSIS — M75102 Unspecified rotator cuff tear or rupture of left shoulder, not specified as traumatic: Secondary | ICD-10-CM | POA: Diagnosis not present

## 2020-02-01 DIAGNOSIS — J45909 Unspecified asthma, uncomplicated: Secondary | ICD-10-CM | POA: Diagnosis not present

## 2020-02-01 DIAGNOSIS — Z7984 Long term (current) use of oral hypoglycemic drugs: Secondary | ICD-10-CM | POA: Diagnosis not present

## 2020-02-01 DIAGNOSIS — Z79899 Other long term (current) drug therapy: Secondary | ICD-10-CM | POA: Diagnosis not present

## 2020-02-01 DIAGNOSIS — I10 Essential (primary) hypertension: Secondary | ICD-10-CM | POA: Diagnosis not present

## 2020-02-01 LAB — BASIC METABOLIC PANEL
Anion gap: 13 (ref 5–15)
BUN: 15 mg/dL (ref 8–23)
CO2: 22 mmol/L (ref 22–32)
Calcium: 8.8 mg/dL — ABNORMAL LOW (ref 8.9–10.3)
Chloride: 104 mmol/L (ref 98–111)
Creatinine, Ser: 0.65 mg/dL (ref 0.44–1.00)
GFR, Estimated: >60 mL/min (ref >60)
Glucose, Bld: 136 mg/dL — ABNORMAL HIGH (ref 70–99)
Potassium: 3.2 mmol/L — ABNORMAL LOW (ref 3.5–5.1)
Sodium: 139 mmol/L (ref 135–145)

## 2020-02-01 LAB — HEMOGLOBIN AND HEMATOCRIT, BLOOD
HCT: 32.7 % — ABNORMAL LOW (ref 36.0–46.0)
Hemoglobin: 10.2 g/dL — ABNORMAL LOW (ref 12.0–15.0)

## 2020-02-01 LAB — GLUCOSE, CAPILLARY: Glucose-Capillary: 135 mg/dL — ABNORMAL HIGH (ref 70–99)

## 2020-02-01 NOTE — Op Note (Signed)
NAME: Amanda Odonnell, Amanda Odonnell MEDICAL RECORD [id number] ACCOUNT [id number] DATE OF BIRTH:06/29/1950 FACILITY: [facility] LOCATION: [facility] PHYSICIAN:STEVEN Russ Halo, MD  OPERATIVE REPORT  DATE OF PROCEDURE:  01/31/2020  PREOPERATIVE DIAGNOSIS:  Left shoulder rotator cuff tear arthropathy.  POSTOPERATIVE DIAGNOSIS:  Left shoulder rotator cuff tear arthropathy.  PROCEDURE PERFORMED:  Left reverse total shoulder replacement using DePuy Delta Xtend prosthesis with no subscap repair.  ATTENDING SURGEON:  Malon Kindle, MD  ASSISTANT:  Modesto Charon, [place], who was scrubbed during the entire procedure and necessary for satisfactory completion of the surgery.  ANESTHESIA:  General anesthesia was used plus interscalene block.  ESTIMATED BLOOD LOSS:  150 mL.  FLUID REPLACEMENT:  1200 mL crystalloid.  INSTRUMENT COUNTS:  Correct.  COMPLICATIONS:  There were no complications.  ANTIBIOTICS:  Perioperative antibiotics were given.  INDICATIONS:  The patient is a 69 year old female who presents with a history of worsening left shoulder pain and dysfunction secondary to end-stage arthritis with rotator cuff insufficiency.  The patient has failed an extended period of conservative  management.  Desires operative treatment to restore function and eliminate pain in the shoulder.  Informed consent obtained.  DESCRIPTION OF PROCEDURE:  After an adequate level of anesthesia was achieved, the patient was positioned in modified beach chair position.  Left shoulder correctly identified and sterilely prepped and draped in the usual manner.  Timeout called,  verifying correct patient, correct site.  We entered the patient's shoulder using standard deltopectoral approach, started at the coracoid process, extending down the anterior humerus, dissection down through subcutaneous tissues using Bovie.  Cephalic  vein was identified and taken laterally,  with the deltoid, pectoralis taken medially.  Conjoined tendon  identified and retracted medially.  Biceps tenodesed in situ with 0 Vicryl figure-of-eight suture.  Deep Cobell retractor was placed.  Subscapularis  was released off the lesser tuberosity.  This was in somewhat degenerated condition with poor tendon quality, it seemed to be a little stiff.  We did do some releases, but again we were a little concerned that this may not be repairable.  We did tag it  for repair potentially at the end.  We then went ahead and released the biceps tendon and a little bit of the anterior supraspinatus.  We progressively externally rotated the shoulder, releasing a little bit more of the supraspinatus back to the  infraspinatus.  We were able to visualize the humeral head, which was completely devoid of any cartilage and had large osteophytes.  We entered the proximal humerus with a 6 mm reamer, reaming up to a size 10.  We then introduced a 10 mm intramedullary  resection guide and resected the head using the head resection guide at 10 degrees of retroversion with the oscillating saw.  We removed excess osteophytes with a rongeur.  We then subluxed the humerus posteriorly, gaining good exposure of the glenoid  face.  There was a lot of retroversion noted.  This was a true B2 glenoid.  We did go ahead and use a rongeur to rongeur down the high side.  We were careful to protect the axillary nerve.  I then centered my guide pin low on the glenoid and corrected  for the version.  We then reamed medially until we got down to good bone support for the metaglene baseplate.  We then drilled out our central peg hole after doing our peripheral hand reaming, making room for the glenosphere.  We had great prep for the  metaglene.  We impacted the  metaglene baseplate, which had good bone support.  We then placed a 42 screw inferiorly, a 30 screw superiorly and a 24 lock screw anteriorly.  We did not have a screw posteriorly.  We had good support for the implant.  We  then used a 38+2  standard glenosphere and attached that to the baseplate.  Once that was screwed down, I did a finger sweep to make sure no soft tissue was caught up in that bearing.  We then finished our humeral preparation, reaming for the 1 left  metaphysis and then impacting the 10 stem with 1 left into 10 degrees of retroversion.  We then selected a 38+3 poly trial and placed on the humeral tray and then reduced the shoulder.  We were happy with our soft tissue balancing and stability.  We  removed the trial components from the humeral side, irrigated thoroughly and then impacted the HA coated 10 stem, 1 left, set on the 0 setting and impacted in 10 degrees of retroversion, using available bone graft from the humeral head with impaction  grafting technique.  We had a very stable stem.  We selected the real 38+3 poly, impacted that onto the humeral tray and reduced the shoulder.  Again, we were happy with soft tissue tensioning and balance.  No gapping with inferior pull or external  rotation.  No restriction in range of motion.  We did remove the remnant of the supraspinatus, then we removed the remnant of the subscapularis, which we felt could not be repaired due to overtightening of the shoulder.  Next, we irrigated again and then  closed deltopectoral interval with 0 Vicryl suture followed by 2-0 Vicryl for subcutaneous closure and 4-0 Monocryl for skin.  Steri-Strips applied followed by sterile dressing.  The patient tolerated the surgery well.  HN/NUANCE  D:01/31/2020 T:02/01/2020 JOB:[id number]

## 2020-02-01 NOTE — Plan of Care (Signed)
Reviewed d/c instructions w pt and husband, all questions answered. Pt states she has all of her belongings, d/c via w/c

## 2020-02-01 NOTE — Progress Notes (Signed)
Orthopedics Progress Note  Subjective: Patient comfortable this AM  Objective:  Vitals:   02/01/20 0229 02/01/20 0528  BP:  140/78  Pulse:  (!) 105  Resp:  18  Temp:  97.9 F (36.6 C)  SpO2: 93% 91%    General: Awake and alert  Musculoskeletal: Left shoulder dressing changed, wound benign, minimal swelling and no drainage Neurovascularly intact  Lab Results  Component Value Date   WBC 8.3 01/23/2020   HGB 10.2 (L) 02/01/2020   HCT 32.7 (L) 02/01/2020   MCV 83.5 01/23/2020   PLT 334 01/23/2020       Component Value Date/Time   NA 139 02/01/2020 0553   K 3.2 (L) 02/01/2020 0553   CL 104 02/01/2020 0553   CO2 22 02/01/2020 0553   GLUCOSE 136 (H) 02/01/2020 0553   BUN 15 02/01/2020 0553   CREATININE 0.65 02/01/2020 0553   CALCIUM 8.8 (L) 02/01/2020 0553   GFRNONAA >60 02/01/2020 0553   GFRAA >60 12/20/2014 0432    No results found for: INR, PROTIME  Assessment/Plan: POD #1 s/p Procedure(s): REVERSE SHOULDER ARTHROPLASTY Stable after surgery, OT seeing patient now, active protocol Discharge to home, follow up in the office in two weeks  Almedia Balls. Ranell Patrick, MD 02/01/2020 8:23 AM

## 2020-02-01 NOTE — Evaluation (Signed)
Occupational Therapy Evaluation Patient Details Name: QIANNA CLAGETT MRN: [id number] DOB: [birth year] Today's Date: 02/01/2020    History of Present Illness 69 year old woman s/p left reverse TSA.   Clinical Impression   Mrs. Helon Wisinski is a 69 year old woman s/p shoulder replacement without functional use of left non-dominant upper extremity secondary to effects of surgery, interscalene block, and shoulder precautions. Therapist provided education and instruction to patient in regards to exercises, precautions, positioning, donning upper extremity clothing and bathing while maintaining shoulder precautions, ice and edema management and donning/doffing sling. Patient verbalized understanding and demonstrated as needed. Patient provided with handouts to maximize retention of all education and instruction. Patient needed assistance to donn shirt, underwear, and pants and provided with instruction on compensatory strategies. Patient to follow up with MD for further therapy needs.      Follow Up Recommendations  Follow surgeon's recommendation for DC plan and follow-up therapies    Equipment Recommendations  Tub/shower seat    Recommendations for Other Services       Precautions / Restrictions Precautions Precautions: Shoulder Type of Shoulder Precautions: AROM/PROM Flexion 0-90, AROM/PROM Abduction 0-60, AROM/PROM ER 30 degrees, NWB, sling for comfort and sleep, AROM of hand, elbow, wrist as tolerated. Shoulder Interventions: Shoulder sling/immobilizer;For comfort;Off for dressing/bathing/exercises Restrictions Weight Bearing Restrictions: Yes LUE Weight Bearing: Non weight bearing      Mobility Bed Mobility Overal bed mobility: Modified Independent                  Transfers Overall transfer level: Modified independent                    Balance Overall balance assessment: Mild deficits observed, not formally tested                                          ADL either performed or assessed with clinical judgement   ADL Overall ADL's : Needs assistance/impaired Eating/Feeding: Set up   Grooming: Modified independent   Upper Body Bathing: Modified independent;Adhering to UE precautions   Lower Body Bathing: Modified independent Lower Body Bathing Details (indicate cue type and reason): adhering to UE precautions Upper Body Dressing : Moderate assistance;Adhering to UE precautions Upper Body Dressing Details (indicate cue type and reason): mod assist to get over head and pull down shirt. Verbalizes understanding of technique Lower Body Dressing: Minimal assistance Lower Body Dressing Details (indicate cue type and reason): able to donn underwear and pants and shoes. Assistance for pulling clothing up over left hip - verbalized understanding of compensatory strategies. Toilet Transfer: Financial controller and Hygiene: Modified independent               Vision Patient Visual Report: No change from baseline       Perception     Praxis      Pertinent Vitals/Pain Pain Assessment: No/denies pain     Hand Dominance Right   Extremity/Trunk Assessment Upper Extremity Assessment Upper Extremity Assessment: RUE deficits/detail;LUE deficits/detail RUE Deficits / Details: WFL ROM and strength LUE Deficits / Details: Decreased AROM secondary to shoulder precautions and interscale block, able to grossly open and close fingers and extend/flex wrist.   Lower Extremity Assessment Lower Extremity Assessment: Overall WFL for tasks assessed   Cervical / Trunk Assessment Cervical / Trunk Assessment: Normal   Communication Communication Communication: No difficulties  Cognition Arousal/Alertness: Awake/alert Behavior During Therapy: WFL for tasks assessed/performed Overall Cognitive Status: Within Functional Limits for tasks assessed                                      General Comments       Exercises     Shoulder Instructions Shoulder Instructions Donning/doffing shirt without moving shoulder: Moderate assistance;Patient able to independently direct caregiver Method for sponge bathing under operated UE: Modified independent Donning/doffing sling/immobilizer: Modified independent;Patient able to independently direct caregiver Correct positioning of sling/immobilizer: Independent ROM for elbow, wrist and digits of operated UE: Independent Sling wearing schedule (on at all times/off for ADL's): Independent Proper positioning of operated UE when showering: Independent Dressing change: Independent Positioning of UE while sleeping: Independent    Home Living Family/patient expects to be discharged to:: Private residence Living Arrangements: Alone                               Additional Comments: Reports has assistance lined up for the first 24 hours and assistance to come and go thereafter.      Prior Functioning/Environment Level of Independence: Independent                 OT Problem List: Decreased range of motion;Decreased strength;Impaired UE functional use      OT Treatment/Interventions:      OT Goals(Current goals can be found in the care plan section) Acute Rehab OT Goals Patient Stated Goal: To recover well OT Goal Formulation: All assessment and education complete, DC therapy  OT Frequency:     Barriers to D/C:            Co-evaluation              AM-PAC OT "6 Clicks" Daily Activity     Outcome Measure Help from another person eating meals?: A Little Help from another person taking care of personal grooming?: None Help from another person toileting, which includes using toliet, bedpan, or urinal?: None Help from another person bathing (including washing, rinsing, drying)?: None Help from another person to put on and taking off regular upper body clothing?: A Lot Help from another person to put  on and taking off regular lower body clothing?: A Little 6 Click Score: 20   End of Session Equipment Utilized During Treatment:  (sling) Nurse Communication:  (Okay to see per RN)  Activity Tolerance: Patient tolerated treatment well Patient left: in chair;with call bell/phone within reach  OT Visit Diagnosis: Muscle weakness (generalized) (M62.81)                Time: 9470-9628 OT Time Calculation (min): 34 min Charges:  OT General Charges $OT Visit: 1 Visit OT Evaluation $OT Eval Low Complexity: 1 Low OT Treatments $Self Care/Home Management : 8-22 mins  Trigg Delarocha, OTR/L Acute Care Rehab Services  Office [phone] Pager: [phone]   Kelli Churn 02/01/2020, 8:56 AM

## 2020-02-01 NOTE — Discharge Summary (Signed)
In most cases prophylactic antibiotics for Dental procdeures after total joint surgery are not necessary.  Exceptions are as follows:  1. History of prior total joint infection  2. Severely immunocompromised (Organ Transplant, cancer chemotherapy, Rheumatoid biologic meds such as Humera)  3. Poorly controlled diabetes (A1C &gt; 8.0, blood glucose over 200)  If you have one of these conditions, contact your surgeon for an antibiotic prescription, prior to your dental procedure. Orthopedic Discharge Summary        Physician Discharge Summary  Patient ID: Amanda Odonnell MRN: [id number] DOB/AGE: 08-31-1950 69 y.o.  Admit date: 01/31/2020 Discharge date: 02/01/2020   Procedures:  Procedure(s) (LRB): REVERSE SHOULDER ARTHROPLASTY (Left)  Attending Physician:  Dr. Malon Kindle  Admission Diagnoses:  Left shoulder end stage OA, rotator cuff insufficiency  Discharge Diagnoses:  same   Past Medical History:  Diagnosis Date  . Allergic rhinitis    takes Singulair at bedtime  . Anemia    takes Ferrous Sulfate daily  . Asthma    Albuterol and Flonase inhalers daily as needed  . Bruises easily   . Cataracts, bilateral   . Chronic back pain    DDD and stenosis.Slight scoliosis  . Complication of anesthesia    with right shoulder oxygen level dropped per pt - approx 3 years ago   . Depression    takes Zoloft daily  . DM (diabetes mellitus) (HCC)    takes Metformin daily type 2   . GERD (gastroesophageal reflux disease)    takes Zantac nightly  . Glaucoma    uses eye drops nightly  . Glucose intolerance (impaired glucose tolerance)   . History of bronchitis 57yrs ago  . Hyperlipidemia    takes Crestor daily  . Hypertension    takes Lozol and Verapamil daily  . Joint pain   . Muscle spasm    takes Robaxin daily as needed  . OCD (obsessive compulsive disorder)   . Osteoarthritis   . Osteoporosis   . Pneumonia 2+yrs ago   hx of  . Vitamin D deficiency      PCP: Pearson Grippe, MD   Discharged Condition: good  Hospital Course:  Patient underwent the above stated procedure on 01/31/2020. Patient tolerated the procedure well and brought to the recovery room in good condition and subsequently to the floor. Patient had an uncomplicated hospital course and was stable for discharge.   Disposition: Discharge disposition: 01-Home or Self Care      with follow up in 2 weeks    Follow-up Information    Beverely Low, MD. Call in 2 weeks.   Specialty: Orthopedic Surgery Why: [phone] Contact information: [address] [place] [address] [phone]               Dental Antibiotics:  In most cases prophylactic antibiotics for Dental procdeures after total joint surgery are not necessary.  Exceptions are as follows:  1. History of prior total joint infection  2. Severely immunocompromised (Organ Transplant, cancer chemotherapy, Rheumatoid biologic meds such as Humera)  3. Poorly controlled diabetes (A1C &gt; 8.0, blood glucose over 200)  If you have one of these conditions, contact your surgeon for an antibiotic prescription, prior to your dental procedure.  Discharge Instructions    Call MD / Call 911   Complete by: As directed    If you experience chest pain or shortness of breath, CALL 911 and be transported to the hospital emergency room.  If you develope a  fever above 101 F, pus (white drainage) or increased drainage or redness at the wound, or calf pain, call your surgeon's office.   Constipation Prevention   Complete by: As directed    Drink plenty of fluids.  Prune juice may be helpful.  You may use a stool softener, such as Colace (over the counter) 100 mg twice a day.  Use MiraLax (over the counter) for constipation as needed.   Diet - low sodium heart healthy   Complete by: As directed    Increase activity slowly as tolerated   Complete by: As directed       Allergies as of 02/01/2020       Reactions   Sulfa Antibiotics Hives      Medication List    STOP taking these medications   cyclobenzaprine 5 MG tablet Commonly known as: FLEXERIL     TAKE these medications   acetaminophen 650 MG CR tablet Commonly known as: TYLENOL Take 1,300 mg by mouth every 8 (eight) hours as needed for pain.   albuterol 108 (90 Base) MCG/ACT inhaler Commonly known as: VENTOLIN HFA Inhale 2 puffs into the lungs every 6 (six) hours as needed for wheezing or shortness of breath.   aspirin 81 MG tablet Take 81 mg by mouth daily.   beta carotene 88416 UNIT capsule Take 25,000 Units by mouth daily.   CALCIUM 500 PO Take 1 tablet by mouth 2 (two) times daily.   calcium carbonate 500 MG chewable tablet Commonly known as: TUMS - dosed in mg elemental calcium Chew 1 tablet by mouth daily as needed for indigestion or heartburn.   CALCIUM-VITAMIN D PO Take 2 tablets by mouth daily.   cholecalciferol 1000 units tablet Commonly known as: VITAMIN D Take 1,000 Units by mouth daily.   CINNAMON PO Take 1,000 mg by mouth 2 (two) times daily.   Co Q 10 100 MG Caps Take 100 mg by mouth daily.   diphenhydrAMINE 25 mg capsule Commonly known as: BENADRYL Take 25 mg by mouth every 6 (six) hours as needed for allergies.   dorzolamide-timolol 22.3-6.8 MG/ML ophthalmic solution Commonly known as: COSOPT Place 1 drop into both eyes 2 (two) times daily.   ferrous sulfate 325 (65 FE) MG tablet Take 325 mg by mouth daily with breakfast.   Fish Oil 1000 MG Caps Take 1,000 mg by mouth 2 (two) times daily.   Fluticasone-Salmeterol 100-50 MCG/DOSE Aepb Commonly known as: ADVAIR Inhale 1 puff into the lungs 2 (two) times daily.   Ginger Root 550 MG Caps Take 1,100 mg by mouth in the morning and at bedtime.   HYDROcodone-acetaminophen 5-325 MG tablet Commonly known as: Norco Take 1 tablet by mouth every 6 (six) hours as needed for moderate pain or severe pain. What changed:   how much to  take  reasons to take this   indapamide 2.5 MG tablet Commonly known as: LOZOL Take 2.5 mg by mouth daily.   latanoprost 0.005 % ophthalmic solution Commonly known as: XALATAN Place 1 drop into both eyes at bedtime.   loratadine 10 MG tablet Commonly known as: CLARITIN Take 10 mg by mouth daily.   Magnesium 250 MG Tabs Take 1 tablet by mouth daily.   metFORMIN 500 MG tablet Commonly known as: GLUCOPHAGE Take 500 mg by mouth daily.   methocarbamol 500 MG tablet Commonly known as: ROBAXIN Take 500 mg by mouth every 6 (six) hours as needed for muscle spasms. What changed: Another medication with the same name  was removed. Continue taking this medication, and follow the directions you see here.   montelukast 10 MG tablet Commonly known as: SINGULAIR Take 10 mg by mouth at bedtime.   multivitamin with minerals tablet Take 1 tablet by mouth daily.   prednisoLONE acetate 1 % ophthalmic suspension Commonly known as: PRED FORTE Place 1 drop into both eyes in the morning and at bedtime.   ranitidine 150 MG capsule Commonly known as: ZANTAC Take 150 mg by mouth every evening.   rosuvastatin 40 MG tablet Commonly known as: CRESTOR Take 20 mg by mouth at bedtime.   sertraline 100 MG tablet Commonly known as: ZOLOFT Take 100 mg by mouth daily.   Simbrinza 1-0.2 % Susp Generic drug: Brinzolamide-Brimonidine Place 1 drop into both eyes 2 (two) times daily.   TURMERIC CURCUMIN PO Take 1,000 mg by mouth in the morning and at bedtime.   verapamil 360 MG 24 hr capsule Commonly known as: VERELAN PM Take 360 mg by mouth every morning.   vitamin C 500 MG tablet Commonly known as: ASCORBIC ACID Take 500 mg by mouth daily.         Signed: Verlee Rossetti 02/01/2020, 8:25 AM  [facility] is now [employer] [address], [place], [place] [address] Phone: [phone] Facebook  Instagram  [employer]

## 2020-02-04 ENCOUNTER — Encounter (HOSPITAL_COMMUNITY): Payer: Self-pay | Admitting: Orthopedic Surgery

## 2020-02-12 DIAGNOSIS — Z4789 Encounter for other orthopedic aftercare: Secondary | ICD-10-CM | POA: Diagnosis not present

## 2020-03-11 DIAGNOSIS — M19012 Primary osteoarthritis, left shoulder: Secondary | ICD-10-CM | POA: Diagnosis not present

## 2020-04-22 DIAGNOSIS — Z4789 Encounter for other orthopedic aftercare: Secondary | ICD-10-CM | POA: Diagnosis not present

## 2020-04-23 ENCOUNTER — Other Ambulatory Visit: Payer: Self-pay | Admitting: Orthopedic Surgery

## 2020-04-23 DIAGNOSIS — I10 Essential (primary) hypertension: Secondary | ICD-10-CM | POA: Diagnosis not present

## 2020-04-23 DIAGNOSIS — R7989 Other specified abnormal findings of blood chemistry: Secondary | ICD-10-CM | POA: Diagnosis not present

## 2020-04-23 DIAGNOSIS — E559 Vitamin D deficiency, unspecified: Secondary | ICD-10-CM | POA: Diagnosis not present

## 2020-04-23 DIAGNOSIS — E7800 Pure hypercholesterolemia, unspecified: Secondary | ICD-10-CM | POA: Diagnosis not present

## 2020-04-23 DIAGNOSIS — M25511 Pain in right shoulder: Secondary | ICD-10-CM

## 2020-04-23 DIAGNOSIS — E119 Type 2 diabetes mellitus without complications: Secondary | ICD-10-CM | POA: Diagnosis not present

## 2020-04-30 DIAGNOSIS — I10 Essential (primary) hypertension: Secondary | ICD-10-CM | POA: Diagnosis not present

## 2020-04-30 DIAGNOSIS — R7989 Other specified abnormal findings of blood chemistry: Secondary | ICD-10-CM | POA: Diagnosis not present

## 2020-04-30 DIAGNOSIS — E785 Hyperlipidemia, unspecified: Secondary | ICD-10-CM | POA: Diagnosis not present

## 2020-04-30 DIAGNOSIS — D509 Iron deficiency anemia, unspecified: Secondary | ICD-10-CM | POA: Diagnosis not present

## 2020-04-30 DIAGNOSIS — E559 Vitamin D deficiency, unspecified: Secondary | ICD-10-CM | POA: Diagnosis not present

## 2020-04-30 DIAGNOSIS — E119 Type 2 diabetes mellitus without complications: Secondary | ICD-10-CM | POA: Diagnosis not present

## 2020-04-30 DIAGNOSIS — M159 Polyosteoarthritis, unspecified: Secondary | ICD-10-CM | POA: Diagnosis not present

## 2020-04-30 DIAGNOSIS — M810 Age-related osteoporosis without current pathological fracture: Secondary | ICD-10-CM | POA: Diagnosis not present

## 2020-05-11 ENCOUNTER — Ambulatory Visit
Admission: RE | Admit: 2020-05-11 | Discharge: 2020-05-11 | Disposition: A | Discharge status: Home / Self Care | Payer: Medicare Other | Source: Ambulatory Visit | Attending: Orthopedic Surgery | Admitting: Orthopedic Surgery

## 2020-05-11 ENCOUNTER — Other Ambulatory Visit: Payer: Self-pay

## 2020-05-11 DIAGNOSIS — M25511 Pain in right shoulder: Secondary | ICD-10-CM

## 2020-05-11 DIAGNOSIS — S42124A Nondisplaced fracture of acromial process, right shoulder, initial encounter for closed fracture: Secondary | ICD-10-CM | POA: Diagnosis not present

## 2020-06-09 DIAGNOSIS — H4043X4 Glaucoma secondary to eye inflammation, bilateral, indeterminate stage: Secondary | ICD-10-CM | POA: Diagnosis not present

## 2020-09-23 DIAGNOSIS — M170 Bilateral primary osteoarthritis of knee: Secondary | ICD-10-CM | POA: Diagnosis not present

## 2020-10-19 DIAGNOSIS — H52223 Regular astigmatism, bilateral: Secondary | ICD-10-CM | POA: Diagnosis not present

## 2020-10-19 DIAGNOSIS — H35033 Hypertensive retinopathy, bilateral: Secondary | ICD-10-CM | POA: Diagnosis not present

## 2020-10-19 DIAGNOSIS — I10 Essential (primary) hypertension: Secondary | ICD-10-CM | POA: Diagnosis not present

## 2020-10-19 DIAGNOSIS — Z961 Presence of intraocular lens: Secondary | ICD-10-CM | POA: Diagnosis not present

## 2020-10-19 DIAGNOSIS — Z9842 Cataract extraction status, left eye: Secondary | ICD-10-CM | POA: Diagnosis not present

## 2020-10-19 DIAGNOSIS — H524 Presbyopia: Secondary | ICD-10-CM | POA: Diagnosis not present

## 2020-10-26 DIAGNOSIS — I10 Essential (primary) hypertension: Secondary | ICD-10-CM | POA: Diagnosis not present

## 2020-10-26 DIAGNOSIS — E785 Hyperlipidemia, unspecified: Secondary | ICD-10-CM | POA: Diagnosis not present

## 2020-10-26 DIAGNOSIS — E119 Type 2 diabetes mellitus without complications: Secondary | ICD-10-CM | POA: Diagnosis not present

## 2020-10-26 DIAGNOSIS — R7989 Other specified abnormal findings of blood chemistry: Secondary | ICD-10-CM | POA: Diagnosis not present

## 2020-10-26 DIAGNOSIS — E559 Vitamin D deficiency, unspecified: Secondary | ICD-10-CM | POA: Diagnosis not present

## 2020-11-02 DIAGNOSIS — E559 Vitamin D deficiency, unspecified: Secondary | ICD-10-CM | POA: Diagnosis not present

## 2020-11-02 DIAGNOSIS — E119 Type 2 diabetes mellitus without complications: Secondary | ICD-10-CM | POA: Diagnosis not present

## 2020-11-02 DIAGNOSIS — M159 Polyosteoarthritis, unspecified: Secondary | ICD-10-CM | POA: Diagnosis not present

## 2020-11-02 DIAGNOSIS — I10 Essential (primary) hypertension: Secondary | ICD-10-CM | POA: Diagnosis not present

## 2020-11-02 DIAGNOSIS — D509 Iron deficiency anemia, unspecified: Secondary | ICD-10-CM | POA: Diagnosis not present

## 2020-11-02 DIAGNOSIS — Z0000 Encounter for general adult medical examination without abnormal findings: Secondary | ICD-10-CM | POA: Diagnosis not present

## 2020-11-02 DIAGNOSIS — N390 Urinary tract infection, site not specified: Secondary | ICD-10-CM | POA: Diagnosis not present

## 2020-11-04 DIAGNOSIS — M25561 Pain in right knee: Secondary | ICD-10-CM | POA: Diagnosis not present

## 2020-11-04 DIAGNOSIS — M2392 Unspecified internal derangement of left knee: Secondary | ICD-10-CM | POA: Diagnosis not present

## 2020-11-04 DIAGNOSIS — M25562 Pain in left knee: Secondary | ICD-10-CM | POA: Diagnosis not present

## 2020-11-20 DIAGNOSIS — M25562 Pain in left knee: Secondary | ICD-10-CM | POA: Diagnosis not present

## 2020-11-30 DIAGNOSIS — S83204A Other tear of unspecified meniscus, current injury, left knee, initial encounter: Secondary | ICD-10-CM | POA: Diagnosis not present

## 2020-12-15 DIAGNOSIS — H4043X4 Glaucoma secondary to eye inflammation, bilateral, indeterminate stage: Secondary | ICD-10-CM | POA: Diagnosis not present

## 2020-12-22 DIAGNOSIS — X58XXXA Exposure to other specified factors, initial encounter: Secondary | ICD-10-CM | POA: Diagnosis not present

## 2020-12-22 DIAGNOSIS — M94262 Chondromalacia, left knee: Secondary | ICD-10-CM | POA: Diagnosis not present

## 2020-12-22 DIAGNOSIS — S83232A Complex tear of medial meniscus, current injury, left knee, initial encounter: Secondary | ICD-10-CM | POA: Diagnosis not present

## 2020-12-22 DIAGNOSIS — M84352A Stress fracture, left femur, initial encounter for fracture: Secondary | ICD-10-CM | POA: Diagnosis not present

## 2020-12-22 DIAGNOSIS — Y999 Unspecified external cause status: Secondary | ICD-10-CM | POA: Diagnosis not present

## 2020-12-22 DIAGNOSIS — G8918 Other acute postprocedural pain: Secondary | ICD-10-CM | POA: Diagnosis not present

## 2020-12-22 DIAGNOSIS — S83282A Other tear of lateral meniscus, current injury, left knee, initial encounter: Secondary | ICD-10-CM | POA: Diagnosis not present

## 2020-12-30 DIAGNOSIS — M25562 Pain in left knee: Secondary | ICD-10-CM | POA: Diagnosis not present

## 2021-01-05 DIAGNOSIS — M25562 Pain in left knee: Secondary | ICD-10-CM | POA: Diagnosis not present

## 2021-01-07 DIAGNOSIS — M25562 Pain in left knee: Secondary | ICD-10-CM | POA: Diagnosis not present

## 2021-01-12 DIAGNOSIS — M25562 Pain in left knee: Secondary | ICD-10-CM | POA: Diagnosis not present

## 2021-01-15 DIAGNOSIS — M25562 Pain in left knee: Secondary | ICD-10-CM | POA: Diagnosis not present

## 2021-01-21 DIAGNOSIS — M25562 Pain in left knee: Secondary | ICD-10-CM | POA: Diagnosis not present

## 2021-01-25 DIAGNOSIS — M25562 Pain in left knee: Secondary | ICD-10-CM | POA: Diagnosis not present

## 2021-04-28 DIAGNOSIS — N390 Urinary tract infection, site not specified: Secondary | ICD-10-CM | POA: Diagnosis not present

## 2021-04-28 DIAGNOSIS — D509 Iron deficiency anemia, unspecified: Secondary | ICD-10-CM | POA: Diagnosis not present

## 2021-04-28 DIAGNOSIS — E119 Type 2 diabetes mellitus without complications: Secondary | ICD-10-CM | POA: Diagnosis not present

## 2021-05-05 ENCOUNTER — Other Ambulatory Visit: Payer: Self-pay | Admitting: Family Medicine

## 2021-05-05 ENCOUNTER — Other Ambulatory Visit: Payer: Self-pay | Admitting: Internal Medicine

## 2021-05-05 DIAGNOSIS — Z0000 Encounter for general adult medical examination without abnormal findings: Secondary | ICD-10-CM | POA: Diagnosis not present

## 2021-05-05 DIAGNOSIS — D509 Iron deficiency anemia, unspecified: Secondary | ICD-10-CM | POA: Diagnosis not present

## 2021-05-05 DIAGNOSIS — E559 Vitamin D deficiency, unspecified: Secondary | ICD-10-CM | POA: Diagnosis not present

## 2021-05-05 DIAGNOSIS — J45909 Unspecified asthma, uncomplicated: Secondary | ICD-10-CM | POA: Diagnosis not present

## 2021-05-05 DIAGNOSIS — Z9109 Other allergy status, other than to drugs and biological substances: Secondary | ICD-10-CM | POA: Diagnosis not present

## 2021-05-05 DIAGNOSIS — M159 Polyosteoarthritis, unspecified: Secondary | ICD-10-CM | POA: Diagnosis not present

## 2021-05-05 DIAGNOSIS — R7989 Other specified abnormal findings of blood chemistry: Secondary | ICD-10-CM | POA: Diagnosis not present

## 2021-05-05 DIAGNOSIS — Z1231 Encounter for screening mammogram for malignant neoplasm of breast: Secondary | ICD-10-CM

## 2021-05-05 DIAGNOSIS — I10 Essential (primary) hypertension: Secondary | ICD-10-CM | POA: Diagnosis not present

## 2021-05-05 DIAGNOSIS — E119 Type 2 diabetes mellitus without complications: Secondary | ICD-10-CM | POA: Diagnosis not present

## 2021-05-10 ENCOUNTER — Other Ambulatory Visit: Payer: Self-pay

## 2021-05-10 ENCOUNTER — Ambulatory Visit
Admission: RE | Admit: 2021-05-10 | Discharge: 2021-05-10 | Disposition: A | Discharge status: Home / Self Care | Payer: Medicare Other | Source: Ambulatory Visit | Attending: Family Medicine | Admitting: Family Medicine

## 2021-05-10 DIAGNOSIS — Z1231 Encounter for screening mammogram for malignant neoplasm of breast: Secondary | ICD-10-CM | POA: Diagnosis not present

## 2021-06-15 DIAGNOSIS — H401131 Primary open-angle glaucoma, bilateral, mild stage: Secondary | ICD-10-CM | POA: Diagnosis not present

## 2021-10-28 DIAGNOSIS — E119 Type 2 diabetes mellitus without complications: Secondary | ICD-10-CM | POA: Diagnosis not present

## 2021-10-28 DIAGNOSIS — D509 Iron deficiency anemia, unspecified: Secondary | ICD-10-CM | POA: Diagnosis not present

## 2021-10-28 DIAGNOSIS — R7989 Other specified abnormal findings of blood chemistry: Secondary | ICD-10-CM | POA: Diagnosis not present

## 2021-10-28 DIAGNOSIS — Z0000 Encounter for general adult medical examination without abnormal findings: Secondary | ICD-10-CM | POA: Diagnosis not present

## 2021-10-28 DIAGNOSIS — E559 Vitamin D deficiency, unspecified: Secondary | ICD-10-CM | POA: Diagnosis not present

## 2021-10-28 DIAGNOSIS — I10 Essential (primary) hypertension: Secondary | ICD-10-CM | POA: Diagnosis not present

## 2021-11-11 DIAGNOSIS — I10 Essential (primary) hypertension: Secondary | ICD-10-CM | POA: Diagnosis not present

## 2021-11-11 DIAGNOSIS — J45909 Unspecified asthma, uncomplicated: Secondary | ICD-10-CM | POA: Diagnosis not present

## 2021-11-11 DIAGNOSIS — Z0000 Encounter for general adult medical examination without abnormal findings: Secondary | ICD-10-CM | POA: Diagnosis not present

## 2021-11-11 DIAGNOSIS — E118 Type 2 diabetes mellitus with unspecified complications: Secondary | ICD-10-CM | POA: Diagnosis not present

## 2021-11-11 DIAGNOSIS — Z9109 Other allergy status, other than to drugs and biological substances: Secondary | ICD-10-CM | POA: Diagnosis not present

## 2021-11-11 DIAGNOSIS — E785 Hyperlipidemia, unspecified: Secondary | ICD-10-CM | POA: Diagnosis not present

## 2021-11-11 DIAGNOSIS — E559 Vitamin D deficiency, unspecified: Secondary | ICD-10-CM | POA: Diagnosis not present

## 2021-12-16 DIAGNOSIS — H401131 Primary open-angle glaucoma, bilateral, mild stage: Secondary | ICD-10-CM | POA: Diagnosis not present

## 2022-05-16 DIAGNOSIS — E559 Vitamin D deficiency, unspecified: Secondary | ICD-10-CM | POA: Diagnosis not present

## 2022-05-16 DIAGNOSIS — E785 Hyperlipidemia, unspecified: Secondary | ICD-10-CM | POA: Diagnosis not present

## 2022-05-16 DIAGNOSIS — I10 Essential (primary) hypertension: Secondary | ICD-10-CM | POA: Diagnosis not present

## 2022-05-16 DIAGNOSIS — E118 Type 2 diabetes mellitus with unspecified complications: Secondary | ICD-10-CM | POA: Diagnosis not present

## 2022-05-23 DIAGNOSIS — E559 Vitamin D deficiency, unspecified: Secondary | ICD-10-CM | POA: Diagnosis not present

## 2022-05-23 DIAGNOSIS — M159 Polyosteoarthritis, unspecified: Secondary | ICD-10-CM | POA: Diagnosis not present

## 2022-05-23 DIAGNOSIS — R7989 Other specified abnormal findings of blood chemistry: Secondary | ICD-10-CM | POA: Diagnosis not present

## 2022-05-23 DIAGNOSIS — E785 Hyperlipidemia, unspecified: Secondary | ICD-10-CM | POA: Diagnosis not present

## 2022-05-23 DIAGNOSIS — Z9109 Other allergy status, other than to drugs and biological substances: Secondary | ICD-10-CM | POA: Diagnosis not present

## 2022-05-23 DIAGNOSIS — E118 Type 2 diabetes mellitus with unspecified complications: Secondary | ICD-10-CM | POA: Diagnosis not present

## 2022-05-23 DIAGNOSIS — M810 Age-related osteoporosis without current pathological fracture: Secondary | ICD-10-CM | POA: Diagnosis not present

## 2022-05-23 DIAGNOSIS — J45909 Unspecified asthma, uncomplicated: Secondary | ICD-10-CM | POA: Diagnosis not present

## 2022-05-23 DIAGNOSIS — I10 Essential (primary) hypertension: Secondary | ICD-10-CM | POA: Diagnosis not present

## 2022-06-20 DIAGNOSIS — H401131 Primary open-angle glaucoma, bilateral, mild stage: Secondary | ICD-10-CM | POA: Diagnosis not present

## 2022-06-22 ENCOUNTER — Encounter: Payer: Self-pay | Admitting: Gastroenterology

## 2022-11-17 DIAGNOSIS — E785 Hyperlipidemia, unspecified: Secondary | ICD-10-CM | POA: Diagnosis not present

## 2022-11-17 DIAGNOSIS — E559 Vitamin D deficiency, unspecified: Secondary | ICD-10-CM | POA: Diagnosis not present

## 2022-11-17 DIAGNOSIS — R7989 Other specified abnormal findings of blood chemistry: Secondary | ICD-10-CM | POA: Diagnosis not present

## 2022-11-17 DIAGNOSIS — I10 Essential (primary) hypertension: Secondary | ICD-10-CM | POA: Diagnosis not present

## 2022-11-17 DIAGNOSIS — E118 Type 2 diabetes mellitus with unspecified complications: Secondary | ICD-10-CM | POA: Diagnosis not present

## 2022-11-24 ENCOUNTER — Other Ambulatory Visit: Payer: Self-pay | Admitting: Family Medicine

## 2022-11-24 DIAGNOSIS — Z0000 Encounter for general adult medical examination without abnormal findings: Secondary | ICD-10-CM | POA: Diagnosis not present

## 2022-11-24 DIAGNOSIS — E118 Type 2 diabetes mellitus with unspecified complications: Secondary | ICD-10-CM | POA: Diagnosis not present

## 2022-11-24 DIAGNOSIS — Z1231 Encounter for screening mammogram for malignant neoplasm of breast: Secondary | ICD-10-CM

## 2022-11-24 DIAGNOSIS — E559 Vitamin D deficiency, unspecified: Secondary | ICD-10-CM | POA: Diagnosis not present

## 2022-11-24 DIAGNOSIS — Z79899 Other long term (current) drug therapy: Secondary | ICD-10-CM | POA: Diagnosis not present

## 2022-11-24 DIAGNOSIS — I10 Essential (primary) hypertension: Secondary | ICD-10-CM | POA: Diagnosis not present

## 2022-11-24 DIAGNOSIS — Z9109 Other allergy status, other than to drugs and biological substances: Secondary | ICD-10-CM | POA: Diagnosis not present

## 2022-11-24 DIAGNOSIS — Z23 Encounter for immunization: Secondary | ICD-10-CM | POA: Diagnosis not present

## 2022-11-24 DIAGNOSIS — M810 Age-related osteoporosis without current pathological fracture: Secondary | ICD-10-CM | POA: Diagnosis not present

## 2022-11-24 DIAGNOSIS — M159 Polyosteoarthritis, unspecified: Secondary | ICD-10-CM | POA: Diagnosis not present

## 2022-11-24 DIAGNOSIS — E785 Hyperlipidemia, unspecified: Secondary | ICD-10-CM | POA: Diagnosis not present

## 2022-12-15 DIAGNOSIS — Z1212 Encounter for screening for malignant neoplasm of rectum: Secondary | ICD-10-CM | POA: Diagnosis not present

## 2022-12-15 DIAGNOSIS — Z1211 Encounter for screening for malignant neoplasm of colon: Secondary | ICD-10-CM | POA: Diagnosis not present

## 2022-12-20 ENCOUNTER — Ambulatory Visit
Admission: RE | Admit: 2022-12-20 | Discharge: 2022-12-20 | Disposition: A | Discharge status: Home / Self Care | Payer: Medicare Other | Source: Ambulatory Visit | Attending: Family Medicine | Admitting: Family Medicine

## 2022-12-20 DIAGNOSIS — Z1231 Encounter for screening mammogram for malignant neoplasm of breast: Secondary | ICD-10-CM

## 2022-12-23 ENCOUNTER — Other Ambulatory Visit: Payer: Self-pay | Admitting: Family Medicine

## 2022-12-23 DIAGNOSIS — R928 Other abnormal and inconclusive findings on diagnostic imaging of breast: Secondary | ICD-10-CM

## 2022-12-27 DIAGNOSIS — H401131 Primary open-angle glaucoma, bilateral, mild stage: Secondary | ICD-10-CM | POA: Diagnosis not present

## 2023-01-03 ENCOUNTER — Ambulatory Visit
Admission: RE | Admit: 2023-01-03 | Discharge: 2023-01-03 | Disposition: A | Discharge status: Home / Self Care | Payer: Medicare Other | Source: Ambulatory Visit | Attending: Family Medicine | Admitting: Family Medicine

## 2023-01-03 DIAGNOSIS — R928 Other abnormal and inconclusive findings on diagnostic imaging of breast: Secondary | ICD-10-CM

## 2023-01-03 DIAGNOSIS — N6001 Solitary cyst of right breast: Secondary | ICD-10-CM | POA: Diagnosis not present

## 2023-01-03 DIAGNOSIS — N6311 Unspecified lump in the right breast, upper outer quadrant: Secondary | ICD-10-CM | POA: Diagnosis not present

## 2023-01-30 DIAGNOSIS — M1712 Unilateral primary osteoarthritis, left knee: Secondary | ICD-10-CM | POA: Diagnosis not present

## 2023-02-21 DIAGNOSIS — M25662 Stiffness of left knee, not elsewhere classified: Secondary | ICD-10-CM | POA: Diagnosis not present

## 2023-02-21 DIAGNOSIS — M25562 Pain in left knee: Secondary | ICD-10-CM | POA: Diagnosis not present

## 2023-02-28 DIAGNOSIS — M25662 Stiffness of left knee, not elsewhere classified: Secondary | ICD-10-CM | POA: Diagnosis not present

## 2023-02-28 DIAGNOSIS — M25562 Pain in left knee: Secondary | ICD-10-CM | POA: Diagnosis not present

## 2023-03-13 DIAGNOSIS — M25662 Stiffness of left knee, not elsewhere classified: Secondary | ICD-10-CM | POA: Diagnosis not present

## 2023-03-13 DIAGNOSIS — M25562 Pain in left knee: Secondary | ICD-10-CM | POA: Diagnosis not present

## 2023-03-21 DIAGNOSIS — M25562 Pain in left knee: Secondary | ICD-10-CM | POA: Diagnosis not present

## 2023-03-21 DIAGNOSIS — M25662 Stiffness of left knee, not elsewhere classified: Secondary | ICD-10-CM | POA: Diagnosis not present

## 2023-03-28 DIAGNOSIS — M25562 Pain in left knee: Secondary | ICD-10-CM | POA: Diagnosis not present

## 2023-03-28 DIAGNOSIS — M25662 Stiffness of left knee, not elsewhere classified: Secondary | ICD-10-CM | POA: Diagnosis not present

## 2023-05-17 DIAGNOSIS — E785 Hyperlipidemia, unspecified: Secondary | ICD-10-CM | POA: Diagnosis not present

## 2023-05-17 DIAGNOSIS — E118 Type 2 diabetes mellitus with unspecified complications: Secondary | ICD-10-CM | POA: Diagnosis not present

## 2023-05-17 DIAGNOSIS — I10 Essential (primary) hypertension: Secondary | ICD-10-CM | POA: Diagnosis not present

## 2023-05-17 DIAGNOSIS — E559 Vitamin D deficiency, unspecified: Secondary | ICD-10-CM | POA: Diagnosis not present

## 2023-05-24 DIAGNOSIS — Z9109 Other allergy status, other than to drugs and biological substances: Secondary | ICD-10-CM | POA: Diagnosis not present

## 2023-05-24 DIAGNOSIS — R7989 Other specified abnormal findings of blood chemistry: Secondary | ICD-10-CM | POA: Diagnosis not present

## 2023-05-24 DIAGNOSIS — J45909 Unspecified asthma, uncomplicated: Secondary | ICD-10-CM | POA: Diagnosis not present

## 2023-05-24 DIAGNOSIS — E785 Hyperlipidemia, unspecified: Secondary | ICD-10-CM | POA: Diagnosis not present

## 2023-05-24 DIAGNOSIS — I10 Essential (primary) hypertension: Secondary | ICD-10-CM | POA: Diagnosis not present

## 2023-05-24 DIAGNOSIS — E559 Vitamin D deficiency, unspecified: Secondary | ICD-10-CM | POA: Diagnosis not present

## 2023-05-24 DIAGNOSIS — E118 Type 2 diabetes mellitus with unspecified complications: Secondary | ICD-10-CM | POA: Diagnosis not present

## 2023-05-24 DIAGNOSIS — M159 Polyosteoarthritis, unspecified: Secondary | ICD-10-CM | POA: Diagnosis not present

## 2023-05-24 DIAGNOSIS — M810 Age-related osteoporosis without current pathological fracture: Secondary | ICD-10-CM | POA: Diagnosis not present

## 2023-07-25 DIAGNOSIS — M1712 Unilateral primary osteoarthritis, left knee: Secondary | ICD-10-CM | POA: Diagnosis not present

## 2023-08-02 DIAGNOSIS — H26491 Other secondary cataract, right eye: Secondary | ICD-10-CM | POA: Diagnosis not present

## 2023-08-02 DIAGNOSIS — H53143 Visual discomfort, bilateral: Secondary | ICD-10-CM | POA: Diagnosis not present

## 2023-08-02 DIAGNOSIS — E119 Type 2 diabetes mellitus without complications: Secondary | ICD-10-CM | POA: Diagnosis not present

## 2023-08-14 DIAGNOSIS — H401131 Primary open-angle glaucoma, bilateral, mild stage: Secondary | ICD-10-CM | POA: Diagnosis not present

## 2023-10-02 DIAGNOSIS — M170 Bilateral primary osteoarthritis of knee: Secondary | ICD-10-CM | POA: Diagnosis not present

## 2023-10-02 DIAGNOSIS — M1711 Unilateral primary osteoarthritis, right knee: Secondary | ICD-10-CM | POA: Diagnosis not present

## 2023-11-20 DIAGNOSIS — E559 Vitamin D deficiency, unspecified: Secondary | ICD-10-CM | POA: Diagnosis not present

## 2023-11-20 DIAGNOSIS — M159 Polyosteoarthritis, unspecified: Secondary | ICD-10-CM | POA: Diagnosis not present

## 2023-11-20 DIAGNOSIS — E785 Hyperlipidemia, unspecified: Secondary | ICD-10-CM | POA: Diagnosis not present

## 2023-11-20 DIAGNOSIS — I10 Essential (primary) hypertension: Secondary | ICD-10-CM | POA: Diagnosis not present

## 2023-11-20 DIAGNOSIS — E118 Type 2 diabetes mellitus with unspecified complications: Secondary | ICD-10-CM | POA: Diagnosis not present

## 2023-11-27 DIAGNOSIS — E785 Hyperlipidemia, unspecified: Secondary | ICD-10-CM | POA: Diagnosis not present

## 2023-11-27 DIAGNOSIS — M810 Age-related osteoporosis without current pathological fracture: Secondary | ICD-10-CM | POA: Diagnosis not present

## 2023-11-27 DIAGNOSIS — I10 Essential (primary) hypertension: Secondary | ICD-10-CM | POA: Diagnosis not present

## 2023-11-27 DIAGNOSIS — T07XXXA Unspecified multiple injuries, initial encounter: Secondary | ICD-10-CM | POA: Diagnosis not present

## 2023-11-27 DIAGNOSIS — Z0000 Encounter for general adult medical examination without abnormal findings: Secondary | ICD-10-CM | POA: Diagnosis not present

## 2023-11-27 DIAGNOSIS — S81802A Unspecified open wound, left lower leg, initial encounter: Secondary | ICD-10-CM | POA: Diagnosis not present

## 2023-11-27 DIAGNOSIS — Z23 Encounter for immunization: Secondary | ICD-10-CM | POA: Diagnosis not present

## 2023-11-27 DIAGNOSIS — M159 Polyosteoarthritis, unspecified: Secondary | ICD-10-CM | POA: Diagnosis not present

## 2023-11-27 DIAGNOSIS — Z9109 Other allergy status, other than to drugs and biological substances: Secondary | ICD-10-CM | POA: Diagnosis not present

## 2023-11-27 DIAGNOSIS — E559 Vitamin D deficiency, unspecified: Secondary | ICD-10-CM | POA: Diagnosis not present

## 2023-11-27 DIAGNOSIS — H409 Unspecified glaucoma: Secondary | ICD-10-CM | POA: Diagnosis not present

## 2023-11-27 DIAGNOSIS — E118 Type 2 diabetes mellitus with unspecified complications: Secondary | ICD-10-CM | POA: Diagnosis not present

## 2023-11-28 DIAGNOSIS — M1712 Unilateral primary osteoarthritis, left knee: Secondary | ICD-10-CM | POA: Diagnosis not present

## 2023-12-05 DIAGNOSIS — M1712 Unilateral primary osteoarthritis, left knee: Secondary | ICD-10-CM | POA: Diagnosis not present

## 2023-12-18 DIAGNOSIS — M1712 Unilateral primary osteoarthritis, left knee: Secondary | ICD-10-CM | POA: Diagnosis not present
